# Patient Record
Sex: Male | Born: 1955 | Race: Black or African American | Hispanic: No | Marital: Married | Smoking: Never smoker
Health system: Southern US, Community
[De-identification: ages and names within clinical notes are randomized; demographics above are authoritative.]

## PROBLEM LIST (undated history)

## (undated) DIAGNOSIS — M1990 Unspecified osteoarthritis, unspecified site: Secondary | ICD-10-CM

## (undated) DIAGNOSIS — E119 Type 2 diabetes mellitus without complications: Secondary | ICD-10-CM

## (undated) DIAGNOSIS — I10 Essential (primary) hypertension: Secondary | ICD-10-CM

## (undated) DIAGNOSIS — E7800 Pure hypercholesterolemia, unspecified: Secondary | ICD-10-CM

## (undated) HISTORY — PX: TONSILLECTOMY: SUR1361

## (undated) HISTORY — PX: KNEE SURGERY: SHX244

---

## 2002-06-08 ENCOUNTER — Ambulatory Visit (HOSPITAL_BASED_OUTPATIENT_CLINIC_OR_DEPARTMENT_OTHER): Admission: RE | Admit: 2002-06-08 | Discharge: 2002-06-08 | Payer: Self-pay | Admitting: Orthopedic Surgery

## 2002-11-07 ENCOUNTER — Ambulatory Visit (HOSPITAL_BASED_OUTPATIENT_CLINIC_OR_DEPARTMENT_OTHER): Admission: RE | Admit: 2002-11-07 | Discharge: 2002-11-07 | Payer: Self-pay | Admitting: Orthopedic Surgery

## 2003-01-17 ENCOUNTER — Emergency Department (HOSPITAL_COMMUNITY): Admission: EM | Admit: 2003-01-17 | Discharge: 2003-01-18 | Payer: Self-pay | Admitting: *Deleted

## 2003-04-08 ENCOUNTER — Inpatient Hospital Stay (HOSPITAL_COMMUNITY): Admission: EM | Admit: 2003-04-08 | Discharge: 2003-04-14 | Payer: Self-pay | Admitting: Emergency Medicine

## 2007-09-11 ENCOUNTER — Emergency Department (HOSPITAL_COMMUNITY): Admission: EM | Admit: 2007-09-11 | Discharge: 2007-09-11 | Payer: Self-pay | Admitting: Emergency Medicine

## 2007-11-19 ENCOUNTER — Ambulatory Visit (HOSPITAL_COMMUNITY): Admission: RE | Admit: 2007-11-19 | Discharge: 2007-11-19 | Payer: Self-pay | Admitting: General Surgery

## 2007-11-19 ENCOUNTER — Encounter (INDEPENDENT_AMBULATORY_CARE_PROVIDER_SITE_OTHER): Payer: Self-pay | Admitting: General Surgery

## 2009-12-16 ENCOUNTER — Ambulatory Visit: Payer: Self-pay | Admitting: Cardiology

## 2009-12-16 ENCOUNTER — Observation Stay (HOSPITAL_COMMUNITY): Admission: EM | Admit: 2009-12-16 | Discharge: 2009-12-19 | Payer: Self-pay | Admitting: Emergency Medicine

## 2011-03-02 LAB — GLUCOSE, CAPILLARY
Glucose-Capillary: 138 mg/dL — ABNORMAL HIGH (ref 70–99)
Glucose-Capillary: 156 mg/dL — ABNORMAL HIGH (ref 70–99)
Glucose-Capillary: 157 mg/dL — ABNORMAL HIGH (ref 70–99)
Glucose-Capillary: 169 mg/dL — ABNORMAL HIGH (ref 70–99)
Glucose-Capillary: 176 mg/dL — ABNORMAL HIGH (ref 70–99)
Glucose-Capillary: 176 mg/dL — ABNORMAL HIGH (ref 70–99)
Glucose-Capillary: 178 mg/dL — ABNORMAL HIGH (ref 70–99)
Glucose-Capillary: 186 mg/dL — ABNORMAL HIGH (ref 70–99)
Glucose-Capillary: 195 mg/dL — ABNORMAL HIGH (ref 70–99)
Glucose-Capillary: 202 mg/dL — ABNORMAL HIGH (ref 70–99)

## 2011-03-02 LAB — BASIC METABOLIC PANEL
BUN: 10 mg/dL (ref 6–23)
BUN: 14 mg/dL (ref 6–23)
BUN: 9 mg/dL (ref 6–23)
CO2: 27 mEq/L (ref 19–32)
CO2: 28 mEq/L (ref 19–32)
CO2: 28 mEq/L (ref 19–32)
Calcium: 8.9 mg/dL (ref 8.4–10.5)
Calcium: 8.9 mg/dL (ref 8.4–10.5)
Calcium: 9.3 mg/dL (ref 8.4–10.5)
Chloride: 102 mEq/L (ref 96–112)
Chloride: 103 mEq/L (ref 96–112)
Chloride: 104 mEq/L (ref 96–112)
Creatinine, Ser: 1.08 mg/dL (ref 0.4–1.5)
Creatinine, Ser: 1.2 mg/dL (ref 0.4–1.5)
Creatinine, Ser: 1.34 mg/dL (ref 0.4–1.5)
GFR calc Af Amer: >60 mL/min (ref >60)
GFR calc Af Amer: >60 mL/min (ref >60)
GFR calc Af Amer: >60 mL/min (ref >60)
GFR calc non Af Amer: 56 mL/min — ABNORMAL LOW (ref >60)
GFR calc non Af Amer: >60 mL/min (ref >60)
GFR calc non Af Amer: >60 mL/min (ref >60)
Glucose, Bld: 146 mg/dL — ABNORMAL HIGH (ref 70–99)
Glucose, Bld: 166 mg/dL — ABNORMAL HIGH (ref 70–99)
Glucose, Bld: 186 mg/dL — ABNORMAL HIGH (ref 70–99)
Potassium: 3.6 mEq/L (ref 3.5–5.1)
Potassium: 3.8 mEq/L (ref 3.5–5.1)
Potassium: 4.3 mEq/L (ref 3.5–5.1)
Sodium: 137 mEq/L (ref 135–145)
Sodium: 137 mEq/L (ref 135–145)
Sodium: 138 mEq/L (ref 135–145)

## 2011-03-02 LAB — CBC
HCT: 40.8 % (ref 39.0–52.0)
HCT: 41.9 % (ref 39.0–52.0)
HCT: 45 % (ref 39.0–52.0)
Hemoglobin: 13.7 g/dL (ref 13.0–17.0)
Hemoglobin: 13.9 g/dL (ref 13.0–17.0)
Hemoglobin: 14.8 g/dL (ref 13.0–17.0)
MCHC: 32.8 g/dL (ref 30.0–36.0)
MCHC: 33.3 g/dL (ref 30.0–36.0)
MCHC: 33.7 g/dL (ref 30.0–36.0)
MCV: 91.2 fL (ref 78.0–100.0)
MCV: 92.7 fL (ref 78.0–100.0)
MCV: 93.4 fL (ref 78.0–100.0)
Platelets: 180 10*3/uL (ref 150–400)
Platelets: 185 10*3/uL (ref 150–400)
Platelets: 198 10*3/uL (ref 150–400)
RBC: 4.4 MIL/uL (ref 4.22–5.81)
RBC: 4.49 MIL/uL (ref 4.22–5.81)
RBC: 4.94 MIL/uL (ref 4.22–5.81)
RDW: 14.9 % (ref 11.5–15.5)
RDW: 15.2 % (ref 11.5–15.5)
RDW: 15.4 % (ref 11.5–15.5)
WBC: 10.3 10*3/uL (ref 4.0–10.5)
WBC: 10.3 10*3/uL (ref 4.0–10.5)
WBC: 11 10*3/uL — ABNORMAL HIGH (ref 4.0–10.5)

## 2011-03-02 LAB — HEMOGLOBIN A1C
Hgb A1c MFr Bld: 6.9 % — ABNORMAL HIGH (ref 4.6–6.1)
Mean Plasma Glucose: 151 mg/dL

## 2011-03-02 LAB — POCT CARDIAC MARKERS
CKMB, poc: 1.3 ng/mL (ref 1.0–8.0)
Myoglobin, poc: 85 ng/mL (ref 12–200)
Troponin i, poc: <0.05 ng/mL (ref 0.00–0.09)

## 2011-03-02 LAB — CK TOTAL AND CKMB (NOT AT ARMC)
CK, MB: 1.7 ng/mL (ref 0.3–4.0)
Relative Index: 0.6 (ref 0.0–2.5)
Total CK: 289 U/L — ABNORMAL HIGH (ref 7–232)

## 2011-03-02 LAB — HEPARIN LEVEL (UNFRACTIONATED)
Heparin Unfractionated: 0.12 IU/mL — ABNORMAL LOW (ref 0.30–0.70)
Heparin Unfractionated: 0.14 IU/mL — ABNORMAL LOW (ref 0.30–0.70)
Heparin Unfractionated: 0.21 IU/mL — ABNORMAL LOW (ref 0.30–0.70)
Heparin Unfractionated: 0.39 IU/mL (ref 0.30–0.70)
Heparin Unfractionated: 0.43 IU/mL (ref 0.30–0.70)

## 2011-03-02 LAB — CARDIAC PANEL(CRET KIN+CKTOT+MB+TROPI)
CK, MB: 1.4 ng/mL (ref 0.3–4.0)
CK, MB: 1.7 ng/mL (ref 0.3–4.0)
CK, MB: 1.7 ng/mL (ref 0.3–4.0)
Relative Index: 0.5 (ref 0.0–2.5)
Relative Index: 0.5 (ref 0.0–2.5)
Relative Index: 0.6 (ref 0.0–2.5)
Total CK: 288 U/L — ABNORMAL HIGH (ref 7–232)
Total CK: 291 U/L — ABNORMAL HIGH (ref 7–232)
Total CK: 340 U/L — ABNORMAL HIGH (ref 7–232)
Troponin I: 0.01 ng/mL (ref 0.00–0.06)
Troponin I: 0.01 ng/mL (ref 0.00–0.06)
Troponin I: 0.01 ng/mL (ref 0.00–0.06)

## 2011-03-02 LAB — LIPID PANEL
Cholesterol: 170 mg/dL (ref 0–200)
HDL: 36 mg/dL — ABNORMAL LOW (ref >39)
LDL Cholesterol: 100 mg/dL — ABNORMAL HIGH (ref 0–99)
Total CHOL/HDL Ratio: 4.7 RATIO
Triglycerides: 169 mg/dL — ABNORMAL HIGH (ref <150)
VLDL: 34 mg/dL (ref 0–40)

## 2011-03-02 LAB — MAGNESIUM: Magnesium: 1.8 mg/dL (ref 1.5–2.5)

## 2011-03-02 LAB — TROPONIN I: Troponin I: 0.01 ng/mL (ref 0.00–0.06)

## 2011-03-02 LAB — DIFFERENTIAL
Basophils Absolute: 0.1 10*3/uL (ref 0.0–0.1)
Basophils Relative: 1 % (ref 0–1)
Eosinophils Absolute: 0.4 10*3/uL (ref 0.0–0.7)
Eosinophils Relative: 4 % (ref 0–5)
Lymphocytes Relative: 36 % (ref 12–46)
Lymphs Abs: 3.7 10*3/uL (ref 0.7–4.0)
Monocytes Absolute: 0.9 10*3/uL (ref 0.1–1.0)
Monocytes Relative: 9 % (ref 3–12)
Neutro Abs: 5.3 10*3/uL (ref 1.7–7.7)
Neutrophils Relative %: 51 % (ref 43–77)

## 2011-03-02 LAB — TSH
TSH: 19.644 u[IU]/mL — ABNORMAL HIGH (ref 0.350–4.500)
TSH: 24.806 u[IU]/mL — ABNORMAL HIGH (ref 0.350–4.500)

## 2011-03-02 LAB — T4, FREE: Free T4: 0.61 ng/dL — ABNORMAL LOW (ref 0.80–1.80)

## 2011-03-02 LAB — APTT: aPTT: 90 seconds — ABNORMAL HIGH (ref 24–37)

## 2011-03-02 LAB — PROTIME-INR
INR: 1.07 (ref 0.00–1.49)
Prothrombin Time: 13.8 seconds (ref 11.6–15.2)

## 2011-04-29 NOTE — Op Note (Signed)
NAMEHYKEEM, OJEDA                ACCOUNT NO.:  [id number]   MEDICAL RECORD NO.:  [id number]          PATIENT TYPE:  AMB   LOCATION:  DAY                           FACILITY:  [facility]   PHYSICIAN:  Tilford Pillar, MD      DATE OF BIRTH:  [birth year]   DATE OF PROCEDURE:  11/19/2007  DATE OF DISCHARGE:                               OPERATIVE REPORT   PREOPERATIVE DIAGNOSIS:  Sebaceous cyst on the frontal scalp.   POSTPROCEDURE DIAGNOSIS:  Sebaceous cyst on the frontal scalp.   PROCEDURES:  Excision of a sebaceous cyst in the scalp 1 cm incision.   SURGEON:  Dr. Tilford Pillar   ANESTHESIA:  Sedation with 1% Xylocaine with epinephrine for local  anesthetic.   SPECIMENS:  Cyst.   ESTIMATED BLOOD LOSS:  Minimal.   INDICATIONS:  The patient is a 55 year old African-American male whom I  had seen previously as an outpatient with a prior infected sebaceous  cyst of the frontal scalp.  This was lanced in my office upon resolution  and reduction of the swelling around this area.  This was because the  patient plans for excision.  Risks, benefits, and alternatives were  discussed at length with the patient.  The patient's questions and  concerns were addressed.  The patient was consented for the planned  procedure.   OPERATION:  The patient was taken to the operating room and was placed  in a supine position on the operating table at which time was given the  sedation and O2 by nasal cannula.  At this point, his scalp was prepped  with Betadine solution.  An eye drape was utilized for draping the area.  At this point, local anesthetic was injected.  An elliptical skin  incision was made over the cyst with the scalpel.  All dissection was  conducted sharply with the scalpel up to the removal of the entire cyst  to make sure all was removed and was sent as a primary specimen to path.  At this point, hemostasis was obtained using a needle tip electrocautery  device.  Once hemostasis was  obtained, additional local anesthetic was  injected.  A 3-0 Vicryl suture was utilized to reapproximate deep  subcuticular tissue in a figure-of-eight fashion.  Again, this  approximated the tissue well, and a 4-0 Monocryl was utilized to  reapproximate the skin edges in a subcuticular fashion.  The skin was  washed and dried with a moist and dry towel.  Benzoin was applied around  the incision.  Quarter-inch Steri-Strips were placed, and drapes were  removed.  The patient was allowed to come out of his sedation and was  transferred to the recovery area in stable condition.  At the conclusion  of the procedure, all instrument, sponge, and needle counts were  correct.  The patient tolerated the procedure well.      Tilford Pillar, MD  Electronically Signed     BZ/MEDQ  D:  11/19/2007  T:  11/19/2007  Job:  [id number]   cc:   Ramon Dredge L. Juanetta Gosling, M.D.  Fax: [phone]

## 2011-04-29 NOTE — H&P (Signed)
NAME:  Lance Petty, RASNIC                ACCOUNT NO.:  [id number]   MEDICAL RECORD NO.:  [id number]          PATIENT TYPE:  AMB   LOCATION:  DAY                           FACILITY:  [facility]   PHYSICIAN:  Tilford Pillar, MD      DATE OF BIRTH:  Mar 18, 1956   DATE OF ADMISSION:  DATE OF DISCHARGE:  LH                              HISTORY & PHYSICAL   CHIEF COMPLAINT:  Scalp sebaceous cyst.   HISTORY OF PRESENT ILLNESS:  The patient is a 55 year old male who  presented with a history of an infected cyst on his forehead, along his  brow line.  This had become exquisitely painful and initially started  draining.  He had had a prior episode similarly in the past which  resolved spontaneously.  This was initially incised and drained in my  office by myself, and he was started on a course of antibiotics.  Upon  resolution, he returns to the office at this point with no  symptomatology, no pain, no additional drainage or swelling in the area.  He has had no fever or chills.   PAST MEDICAL HISTORY:  1. Diabetes mellitus type 2.  He is insulin dependent.  2. No cardiac or pulmonary issues or disease that the patient knows      of.   PAST SURGICAL HISTORY:  None.   MEDICATIONS:  Insulin.   ALLERGIES:  No known drug allergies.   SOCIAL HISTORY:  No tobacco use.  No alcohol use.   REVIEW OF SYSTEMS:  CONSTITUTIONAL:  Unremarkable.  EYES:  Unremarkable.  ENT:  Occasional sinus infections, otherwise unremarkable.  CARDIOVASCULAR:  Unremarkable.  PULMONARY:  Unremarkable.  GASTROINTESTINAL:  Unremarkable.  NEUROLOGIC:  Unremarkable.  ENDOCRINE:  Unremarkable.  MUSCULOSKELETAL:  Unremarkable.  SKIN:  As per HPI.  Otherwise unremarkable.   PHYSICAL EXAMINATION:  GENERAL:  On evaluation in my office, he is in no  acute distress.  He is alert and oriented x3.  HEENT:  Pupils are equal, round, and reactive.  Extraocular movements  are intact.  No scleral icterus or conjunctival pallor is noted.  He has  a small cyst on his frontal scalp, less than 0.5 cm in size.  This is  nontender.  This is very mobile.  It does not feel attached to the  underlying tissue or bone.  No decreased hearing is apparent.  Oral  mucosa is pink and moist.  NECK:  No cervical lymphadenopathy is apparent.  PULMONARY:  Unlabored respirations.  Clear to auscultation bilaterally.  CARDIOVASCULAR:  Regular rate and rhythm.  2+ radial pulses bilaterally.  ABDOMEN:  Soft, nontender, nondistended.  EXTREMITIES:  Warm and dry.   ASSESSMENT AND PLAN:  Sebaceous cyst of frontal scalp.  Risks, benefits,  and alternatives of excision were discussed at length with the patient.  His questions and concerns were addressed, and the patient will be  consented for the planned excision as an outpatient procedure.  At this  point, there is no appearance of any infection with the cyst, and again  we will schedule him for excision of the cyst.  Tilford Pillar, MD  Electronically Signed     BZ/MEDQ  D:  11/16/2007  T:  11/17/2007  Job:  [id number]   cc:   [facility]  Fax: [phone]   Oneal Deputy. Juanetta Gosling, M.D.  Fax: [phone]

## 2011-05-02 NOTE — Group Therapy Note (Signed)
   NAMEGRANTLAND, WANT [facility]                        ACCOUNT NO.:  [id number]   MEDICAL RECORD NO.:  [id number]                   PATIENT TYPE:  INP   LOCATION:  A311                                 FACILITY:  [facility]   PHYSICIAN:  Edward L. Juanetta Gosling, M.D.             DATE OF BIRTH:  03-Aug-1956   DATE OF PROCEDURE:  DATE OF DISCHARGE:  04/14/2003                                   PROGRESS NOTE   PROBLEMS:  Diabetes, sinus infection, headaches related to sinus infections.   SUBJECTIVE:  GENERAL APPEARANCE:  The patient says he feels well today and  has no new complaints.  He is not having any headache now and basically  feels very well.  His exam shows that he looks much more comfortable.  VITAL SIGNS:  His temperature is 97.8, pulse 69, respirations 20, blood  sugars run from 169 to 215.  Blood pressure 148/75.  His I&O +1450.  CHEST:  Quite clear.  HEART:  Regular.  ABDOMEN:  Soft.   ASSESSMENT:  He is much improved.   PLAN:  Discharge today, please see discharge summary for details.                                               Edward L. Juanetta Gosling, M.D.    ELH/MEDQ  D:  04/14/2003  T:  04/14/2003  Job:  [id number]

## 2011-05-02 NOTE — Group Therapy Note (Signed)
   Lance Petty, Lance Petty [facility]                        ACCOUNT NO.:  [id number]   MEDICAL RECORD NO.:  [id number]                   PATIENT TYPE:  INP   LOCATION:  A201                                 FACILITY:  [facility]   PHYSICIAN:  Edward L. Juanetta Gosling, M.D.             DATE OF BIRTH:  [birth year]   DATE OF PROCEDURE:  04/13/2003  DATE OF DISCHARGE:                                   PROGRESS NOTE   PROBLEMS:  1. Diabetes.  2. Sinus infection.   SUBJECTIVE:  [name] says that he feels much better today.  He has no new  complaints. His headache is, in essence, resolved.   OBJECTIVE:  His exam shows that his temperature is 98.1; pulse 76;  respirations 20.  Blood sugar was 62 and in the 150s this morning.  Blood  pressure 146/101.  His sinuses are less tender.  His chest is clear.  His  heart is regular.  Overall he looks better.   ASSESSMENT:  He is much improved.   PLAN:  The plan is to continue his antibiotics, etc. today.  I am hoping to  get him discharged in the morning providing he does not have any new  problems.                                               Edward L. Juanetta Gosling, M.D.    ELH/MEDQ  D:  04/13/2003  T:  04/13/2003  Job:  [id number]

## 2011-05-02 NOTE — Op Note (Signed)
[place]  Patient:    Lance Petty, Lance Petty [facility] Visit Number: [id number] MRN: [id number]          Service Type: DSU Location: [facility] Attending Physician:  Milly Jakob Dictated by:   Harvie Junior, M.D. Proc. Date: 06/08/02 Admit Date:  06/08/2002                             Operative Report  PREOPERATIVE DIAGNOSIS:  Degenerative disk disease with significant patellofemoral pain.  POSTOPERATIVE DIAGNOSIS:  Degenerative disk disease with significant patellofemoral pain.  PROCEDURES: 1. Chondroplasty, medial femoral condyle. 2. Chondroplasty, patellofemoral joint. 3. Lateral retinacular release. 4. Removal of multiple osteochondral loose bodies.  SURGEON:  Harvie Junior, M.D.  ASSISTANT:  Currie Paris. Thedore Mins.  ANESTHESIA:  General.  BRIEF HISTORY:  A 55 year old male with a long history of having significant knee pain.  He ultimately spent a long time walking on concrete floors doing heavy work and has been having significant increasing stiffness and pain in the knee joint.  Because of continued complaints of stiffness and knee pain, the patient was ultimately evaluated in the office with an MRI in hand and noted to have significant thinning of the articular cartilage on the medial side as well as on the patellofemoral joint.  Because of this and failure of all conservative care, the patient was taken to the operating room for operative knee arthroscopy.  DESCRIPTION OF PROCEDURE:  The patient was brought to the operating room and after adequate anesthesia was obtained with a general anesthetic, the patient was placed supine upon the operating table.  The right leg was prepped and draped in the usual sterile fashion.  Following this, routine arthroscopic examination of the knee revealed that there was obvious chondromalacia in the patella with significant chondromalacia of the medial patellar facet. Ultimately the patellar tracking  was somewhat lateral.  At this point the patella was debrided back to a smooth and stable rim.  Attention was then turned to the medial joint line, where there were noted to be significant grade 2 and 3 changes in the medial femoral condyle.  This was debrided back to a smooth and table rim.  Attention was turned laterally, where it was noted to be normal.  Multiple cartilaginous loose bodies were identified within the knee and continued to be irrigated out throughout the case.  The anterior cruciate was identified and noted to be normal.  The medial side was smoothed down to a smooth and stable rim.  The medial meniscus was identified.  It had a little bit of fray but ultimately was stable back to the rim.  Attention was turned to the back of the patellofemoral joint, where there was noted to be a tight lateral retinaculum with lateral patellar tracking.  A lateral retinacular release was performed.  At this point the knee was copiously irrigated and suctioned dry.  The arthroscopic portals were closed with a bandage and a sterile compressive dressing was applied and the patient taken to recovery, where he was noted to be in satisfactory condition.  Estimated blood loss for the procedure was none. Dictated by:   Harvie Junior, M.D. Attending Physician:  Milly Jakob DD:  06/08/02 TD:  06/09/02 Job: [id number]

## 2011-05-02 NOTE — H&P (Signed)
   [name], Lance Petty [facility]                        ACCOUNT NO.:  [id number]   MEDICAL RECORD NO.:  [id number]                   PATIENT TYPE:  INP   LOCATION:  A201                                 FACILITY:  [facility]   PHYSICIAN:  Angus G. Renard Matter, M.D.              DATE OF BIRTH:  [birth year]   DATE OF ADMISSION:  04/08/2003  DATE OF DISCHARGE:                                HISTORY & PHYSICAL   No dictation for this job.                                               Angus G. Renard Matter, M.D.    AGM/MEDQ  D:  04/08/2003  T:  04/08/2003  Job:  [id number]

## 2011-05-02 NOTE — Group Therapy Note (Signed)
   Lance Petty, Lance Petty [facility]                        ACCOUNT NO.:  [id number]   MEDICAL RECORD NO.:  [id number]                   PATIENT TYPE:  INP   LOCATION:  A201                                 FACILITY:  [facility]   PHYSICIAN:  Edward L. Juanetta Gosling, M.D.             DATE OF BIRTH:  08-05-1956   DATE OF PROCEDURE:  04/11/2003  DATE OF DISCHARGE:                                   PROGRESS NOTE   PROBLEM:  Diabetes mellitus, uncontrolled.   SUBJECTIVE:  The patient continues to feel fairly well.  He had a headache  last night.  He was started on Humulin N yesterday and will receive another  dose today.  He is still receiving D-5 at 150 so I am going to stop that.  He has no other new complaints.   OBJECTIVE:  His physical exam shows his temperature is 98.7, pulse 75,  respirations 20, blood sugar 271, blood pressure 138/97.  His blood sugar,  however, has been as high as 456.  His I&O is +2251.  His chest is pretty  clear, his heart is regular, his abdomen is soft.   ASSESSMENT:  He seems to be doing at least a little bit better but he still  has uncontrolled blood sugar.  The cause of his sugar being so uncontrolled  is not clear at this point.   PLAN:  Continue with his current long-acting insulin, continue with his  sliding scale, and add Glucophage to the mix and see if it will make any  difference.  I do not plan any other new treatments now.                                               Edward L. Juanetta Gosling, M.D.    ELH/MEDQ  D:  04/11/2003  T:  04/11/2003  Job:  [id number]

## 2011-05-02 NOTE — Discharge Summary (Signed)
   NAMEJERMOND, Lance Petty [facility]                        ACCOUNT NO.:  [id number]   MEDICAL RECORD NO.:  [id number]                   PATIENT TYPE:  INP   LOCATION:  A311                                 FACILITY:  [facility]   PHYSICIAN:  Edward L. Juanetta Gosling, M.D.             DATE OF BIRTH:  09-Oct-1956   DATE OF ADMISSION:  04/08/2003  DATE OF DISCHARGE:                                 DISCHARGE SUMMARY   FINAL DIAGNOSES:  1. Uncontrolled diabetes.  2. Sinusitis.  3. Headaches related to sinusitis.   HISTORY OF PRESENT ILLNESS:  The patient is a 55 year old who has had a long  known history of diabetes and difficulty controlling his blood sugars over  the last several days.  He has had increased thirst, increased urination.  He has been to my office.  He was given a sliding scale regular insulin, but  did not seem to make much difference.  He then came on to the hospital with  blood sugar over 500.  He was given saline and insulin but clearly needed to  be admitted.   PHYSICAL EXAMINATION:  GENERAL APPEARANCE:  He appeared to be dehydrated.  HEENT:  Nose and throat were clear.  NECK:  Supple without masses.  CHEST:  Fairly clear.  HEART:  Regular.  ABDOMEN:  Soft.   LABORATORY DATA:  White count 6300, hemoglobin 16.6, hematocrit 46.3.  Sodium 122, potassium 3.9, chloride 93, CO2 22, glucose 548, BUN 5,  creatinine 1.2, calcium 8.3.   HOSPITAL COURSE:  He was started on intravenous insulin and improved.  He  had difficulty with headache and eventually underwent CT scan of the head  and sinus which showed that he had completely opacified right maxillary  sinus.  This was treated with Rocephin and decongestants and improved.  His  headache resolved, and he was discharged home in much improved condition.   DISCHARGE MEDICATIONS:  1. Glucophage 1 g b.i.d.  2. Humulin N 20 units b.i.d.  3. Cefzil 250 mg b.i.d. for 10 days.  4. Vioxx 25 mg daily if needed for headache.  5. Entex PSE one b.i.d.  p.r.n. congestion.  6. He is to continue sliding scale Humulin that he is already on.    DISCHARGE INSTRUCTIONS:  He should check his blood sugar four times a day.   FOLLOWUP:  He will follow up in my office in about a month.                                               Edward L. Juanetta Gosling, M.D.    ELH/MEDQ  D:  04/14/2003  T:  04/14/2003  Job:  [id number]

## 2011-05-02 NOTE — Group Therapy Note (Signed)
   Lance Petty, Lance Petty                            ACCOUNT NO.:  [id number]   MEDICAL RECORD NO.:  [id number]                  PATIENT TYPE:   LOCATION:                                       FACILITY:   PHYSICIAN:  Angus G. Renard Matter, M.D.              DATE OF BIRTH:   DATE OF PROCEDURE:  04/09/2003  DATE OF DISCHARGE:                                   PROGRESS NOTE   SUBJECTIVE:  This patient was admitted with hyperglycemia.  He received  insulin drip through the night and sugar has dropped into the 200 range.  He  is now on sliding scale insulin.  He is feeling somewhat better.   OBJECTIVE:  VITAL SIGNS: Blood pressure 135/58, respirations 20, pulse 73,  temperature 98.3.  HEART:  Regular rhythm.  LUNGS:  Clear to P&A.  ABDOMEN:  No palpable organs or masses. Somewhat distended abdomen.   ASSESSMENT:  The patient was admitted with hyperglycemia and insulin-  dependent diabetes in poor control.   PLAN:  Plan to change to sliding scale Humalog insulin today.  Continue to  monitor Metabolic-7.                                               Angus G. Renard Matter, M.D.    AGM/MEDQ  D:  04/09/2003  T:  04/09/2003  Job:  [id number]

## 2011-05-02 NOTE — Group Therapy Note (Signed)
   NAMECYNTHIA, STAINBACK [facility]                        ACCOUNT NO.:  [id number]   MEDICAL RECORD NO.:  [id number]                   PATIENT TYPE:  INP   LOCATION:  A201                                 FACILITY:  [facility]   PHYSICIAN:  Edward L. Juanetta Gosling, M.D.             DATE OF BIRTH:  12-06-1956   DATE OF PROCEDURE:  DATE OF DISCHARGE:                                   PROGRESS NOTE   PROBLEMS:  1. Diabetes uncontrolled.  2. Probable prostatitis.   SUBJECTIVE:  The patient said he is feeling better than he was when he was  admitted.  He came in Saturday 04/08/03 with uncontrolled diabetes.  He had  been seen in my office earlier on Friday with the same thing.  He was given  a sliding scale but this obviously did not work.  He still, however, has  blood sugars that are in the 300s.  We discussed progress at length and to  some extent it appears that his diabetes has been uncontrolled because he  has not been eating properly.  He has been traveling with his son.   OBJECTIVE:  VITAL SIGNS:  Blood pressure 128/80, pulse is 80, respirations  18.  HEENT:  Mucus membranes are slightly dry.  Nose and throat are clear.  NECK:  Supple.  CHEST:  Fairly clear without wheezes, rales, or rhonchi.  HEART:  Regular without murmurs, rubs, or gallops.   LABORATORY DATA:  His blood sugar this morning is over 300.   ASSESSMENT:  He is still not controlled.   PLAN:  Continue treatment and medications to follow.  No new changes today.                                               Edward L. Juanetta Gosling, M.D.    ELH/MEDQ  D:  04/10/2003  T:  04/10/2003  Job:  [id number]

## 2011-05-02 NOTE — Group Therapy Note (Signed)
   Lance Petty, GEISSINGER [facility]                        ACCOUNT NO.:  [id number]   MEDICAL RECORD NO.:  [id number]                   PATIENT TYPE:  INP   LOCATION:  A201                                 FACILITY:  [facility]   PHYSICIAN:  Edward L. Juanetta Gosling, M.D.             DATE OF BIRTH:  10/10/1956   DATE OF PROCEDURE:  04/12/2003  DATE OF DISCHARGE:                                   PROGRESS NOTE   PROBLEMS:  1. Diabetes, uncontrolled.  2. Headaches.   SUBJECTIVE:  The patient says he is feeling better this morning.  He has no  new complaints.  He has no nausea or vomiting.  His headache is still  present but better.   OBJECTIVE:  Exam today shows temperature 97.4, pulse is 97, respirations 20.  Blood sugar 209 last night; it has not been done yet this morning.  Blood  pressure 136/77.  His chest is clear, his heart is regular, his headache is  much improved, his sinuses are nontender.  I&O is -2.   ASSESSMENT:  He is much better.   PLAN:  Continue with treatments.  He had CT of the brain and sinuses  yesterday which preliminary report showed opacification of the right  maxillary sinus and a great deal of mucous.  That would probably be the  cause of his headaches; his brain was normal.                                               Edward L. Juanetta Gosling, M.D.    ELH/MEDQ  D:  04/12/2003  T:  04/12/2003  Job:  [id number]

## 2011-05-02 NOTE — Op Note (Signed)
Lance, [name]                        ACCOUNT NO.:  [id number]   MEDICAL RECORD NO.:  [id number]                   PATIENT TYPE:  AMB   LOCATION:  DSC                                  FACILITY:  [facility]   PHYSICIAN:  Harvie Junior, M.D.                DATE OF BIRTH:  1956-02-03   DATE OF PROCEDURE:  DATE OF DISCHARGE:                                 OPERATIVE REPORT   This is a 55 year old male with no previous surgery.   PREOPERATIVE DIAGNOSIS:  Knee pain, status post arthroscopy with lateral  release.   POSTOPERATIVE DIAGNOSES:  1. Chondromalacia of the medial femoral condyle.  2. Chondromalacia of the patella.  3. Synovial proliferation.   PRINCIPAL PROCEDURE:  1. Chondroplasty of the medial femoral condyle.  2. Chondroplasty of the under surface of the patella.  3. Synovectomy, medial and lateral.   SURGEON:  Dr. Luiz Blare.   ANESTHESIA:  General.   BRIEF HISTORY:  [name] is a 55 year old gentleman with a long history of  having significant right knee pain.  He ultimately underwent arthroscopy and  at that time he was noted to have chondromalacia of the medial femoral  condyle as well as chondromalacia on the posterior surface of the patella.  These were debrided and ultimately did a tracking procedure correction with  his initial operation.  He ultimately, postoperatively, never really did  well.  He had continued chronic effusion and because of this continued  chronic effusion, he ultimately was taken to the operating room for a repeat  arthroscopy.  Arthroscopy at that time showed that there was no significant  isolated mechanical abnormality.  He had significant grade 2 and some mild  grade 3 changes on the medial femoral condyle which was debrided with a  suction shaver, fairly significant proliferation of scar tissue medially and  laterally in the gutters, this was debrided.  There was fairly significant  chondromalacia on the posterior surface of the  patella, grade 2 and grade 3  changes, and again, this was debrided and smoothed back to a smooth and  stable rim. Once this was accomplished, the lateral tibial plateau was  identified and noted to have a little bit of grade 2 change, but really  looked quite good.  The lateral femoral condyle was without abnormality.  The lateral meniscus was normal.  Completion of the synovectomy was done  medially and laterally as well as final debridement of the articular  surfaces.  Once this was completed, the knee was copiously irrigated with  six liters of normal saline irrigation and suctioned dry.  Postoperative  portals were closed with bandages and sterile compressive dressing was  applied, the patient was taken to the recovery room and was noted to be in  satisfactory condition.  Estimated blood loss of this procedure was none.  Harvie Junior, M.D.   Ranae Plumber  D:  11/07/2002  T:  11/07/2002  Job:  [id number]

## 2011-05-02 NOTE — H&P (Signed)
   NAMEKEMOND, AMORIN [facility]                        ACCOUNT NO.:  [id number]   MEDICAL RECORD NO.:  [id number]                   PATIENT TYPE:  INP   LOCATION:  A201                                 FACILITY:  [facility]   PHYSICIAN:  Angus G. Renard Matter, M.D.              DATE OF BIRTH:  05-12-1956   DATE OF ADMISSION:  04/08/2003  DATE OF DISCHARGE:                                HISTORY & PHYSICAL   HISTORY OF PRESENT ILLNESS:  A 55 year old African American male who has  been a known diabetic for several years states that he had difficulty  controlling his blood sugars over the past few days.  He has had symptoms of  diabetes excessive thirst, excess urination for several weeks.  His blood  sugars had been difficult to control and he presented himself to the  emergency room where his blood sugars were over 500.  He was started on  intravenous fluids normal saline after a saline bolus and given 10 units of  regular insulin.  Subsequently he was admitted.   ADMISSION LABORATORY DATA:  His laboratory data at the time of admission,  CBC WBC 6,300, hemoglobin 16.6, hematocrit 46.3.  Electrolytes sodium 122,  potassium 3.9, chloride 93, CO2 22, glucose 548, BUN 5, creatinine 1.2,  calcium 8.3.  Patient was admitted for further care.   FAMILY HISTORY:  Positive for heart disease and diabetes.   SOCIAL HISTORY:  Patient does not smoke or drink alcohol.   PAST MEDICAL AND SURGICAL HISTORY:  1. Patient has had several arthroscopic surgeries on his right knee.  2. Tonsillectomy.  3. Apparently had one hospitalization several years ago for treatment of     diabetes.   REVIEW OF SYSTEMS:  HEENT:  Negative.  CARDIOPULMONARY:  No problem with hemoptysis or dyspnea.  GI:  Increased thirst.  Increased appetite.  GU:  Frequency of urination.   ALLERGIES:  NO KNOWN DRUG ALLERGIES.   MEDICATIONS:  1. Humulin N insulin 60 units daily.  2. Humulin R.   PHYSICAL EXAMINATION:  GENERAL:  Alert male with  respirations and temp.  HEENT:  Eyes, PERRLA.  Tympanic membranes are negative.  Oropharynx benign.  NECK:  Supple.  No JVD or thyroid abnormalities.  LUNGS:  Clear to P&A.  HEART:  Regular rhythm.  ABDOMEN:  Without murmurs or masses.  EXTREMITIES:  Clear of edema .   DIAGNOSIS:  Insulin-dependent-diabetes with hyperglycemia.                                                Angus G. Renard Matter, M.D.    AGM/MEDQ  D:  04/08/2003  T:  04/08/2003  Job:  [id number]

## 2011-09-22 LAB — CBC
HCT: 45.5
Hemoglobin: 14.8
MCHC: 32.5
MCV: 91
Platelets: 208
RBC: 5
RDW: 14.3
WBC: 10.8 — ABNORMAL HIGH

## 2011-09-22 LAB — BASIC METABOLIC PANEL
BUN: 12
CO2: 27
Calcium: 9.2
Chloride: 100
Creatinine, Ser: 1.05
GFR calc Af Amer: >60
GFR calc non Af Amer: >60
Glucose, Bld: 198 — ABNORMAL HIGH
Potassium: 4
Sodium: 137

## 2011-09-25 LAB — CULTURE, ROUTINE-ABSCESS

## 2013-10-21 ENCOUNTER — Emergency Department (HOSPITAL_COMMUNITY)
Admission: EM | Admit: 2013-10-21 | Discharge: 2013-10-21 | Disposition: A | Discharge status: Home / Self Care | Payer: BC Managed Care – PPO | Attending: Emergency Medicine | Admitting: Emergency Medicine

## 2013-10-21 ENCOUNTER — Encounter (HOSPITAL_COMMUNITY): Payer: Self-pay | Admitting: Emergency Medicine

## 2013-10-21 DIAGNOSIS — E7800 Pure hypercholesterolemia, unspecified: Secondary | ICD-10-CM | POA: Insufficient documentation

## 2013-10-21 DIAGNOSIS — K047 Periapical abscess without sinus: Secondary | ICD-10-CM | POA: Insufficient documentation

## 2013-10-21 DIAGNOSIS — E119 Type 2 diabetes mellitus without complications: Secondary | ICD-10-CM | POA: Insufficient documentation

## 2013-10-21 DIAGNOSIS — I10 Essential (primary) hypertension: Secondary | ICD-10-CM | POA: Insufficient documentation

## 2013-10-21 DIAGNOSIS — K029 Dental caries, unspecified: Secondary | ICD-10-CM | POA: Insufficient documentation

## 2013-10-21 HISTORY — DX: Pure hypercholesterolemia, unspecified: E78.00

## 2013-10-21 HISTORY — DX: Type 2 diabetes mellitus without complications: E11.9

## 2013-10-21 HISTORY — DX: Essential (primary) hypertension: I10

## 2013-10-21 MED ORDER — AMOXICILLIN 250 MG PO CAPS
500.0000 mg | ORAL_CAPSULE | Freq: Once | ORAL | Status: AC
  Administered 2013-10-21: 500 mg via ORAL
  Filled 2013-10-21: qty 2

## 2013-10-21 MED ORDER — HYDROMORPHONE HCL PF 1 MG/ML IJ SOLN
1.0000 mg | Freq: Once | INTRAMUSCULAR | Status: AC
  Administered 2013-10-21: 1 mg via INTRAMUSCULAR
  Filled 2013-10-21: qty 1

## 2013-10-21 MED ORDER — OXYCODONE-ACETAMINOPHEN 5-325 MG PO TABS: 1.0000 | ORAL_TABLET | ORAL | Status: DC | PRN

## 2013-10-21 MED ORDER — AMOXICILLIN 500 MG PO CAPS: 500.0000 mg | ORAL_CAPSULE | Freq: Three times a day (TID) | ORAL | Status: DC

## 2013-10-21 NOTE — ED Notes (Signed)
Dental pain for 1 week

## 2013-10-21 NOTE — ED Provider Notes (Signed)
Medical screening examination/treatment/procedure(s) were performed by non-physician practitioner and as supervising physician I was immediately available for consultation/collaboration.  EKG Interpretation   None         Benny Lennert, MD 10/21/13 1945

## 2013-10-21 NOTE — ED Provider Notes (Signed)
CSN: [id number]     Arrival date & time 10/21/13  1716 History   First MD Initiated Contact with Patient 10/21/13 1807     Chief Complaint  Patient presents with  . Dental Pain   (Consider location/radiation/quality/duration/timing/severity/associated sxs/prior Treatment) HPI Comments: Lance Petty is a 57 y.o. Male presenting with a 1 week history of dental pain and gingival swelling.   The patient has a history of injury to the tooth involved,  With the filling falling out 2 weeks ago,  And now has developed increased pain and swelling around the tooth.  There has been no fevers,  Chills, nausea or vomiting, also no complaint of difficulty swallowing,  Although chewing makes pain worse.  The patient has tried ibuprofen without relief of symptoms.         The history is provided by the patient.    Past Medical History  Diagnosis Date  . Diabetes mellitus without complication   . Hypertension   . Hypercholesteremia    Past Surgical History  Procedure Laterality Date  . Knee surgery     History reviewed. No pertinent family history. History  Substance Use Topics  . Smoking status: Never Smoker   . Smokeless tobacco: Not on file  . Alcohol Use: No    Review of Systems  Constitutional: Negative for fever.  HENT: Positive for dental problem. Negative for facial swelling and sore throat.   Respiratory: Negative for shortness of breath.   Musculoskeletal: Negative for neck pain and neck stiffness.    Allergies  Review of patient's allergies indicates no known allergies.  Home Medications   Current Outpatient Rx  Name  Route  Sig  Dispense  Refill  . amoxicillin (AMOXIL) 500 MG capsule   Oral   Take 1 capsule (500 mg total) by mouth 3 (three) times daily.   30 capsule   0   . oxyCODONE-acetaminophen (PERCOCET/ROXICET) 5-325 MG per tablet   Oral   Take 1 tablet by mouth every 4 (four) hours as needed for severe pain.   20 tablet   0    BP 157/87  Pulse 73   Temp(Src) 98.2 F (36.8 C) (Oral)  Resp 20  Ht 5\' 10"  (1.778 m)  Wt 230 lb (104.327 kg)  BMI 33.00 kg/m2  SpO2 99% Physical Exam  Constitutional: He is oriented to person, place, and time. He appears well-developed and well-nourished. No distress.  HENT:  Head: Normocephalic and atraumatic.  Right Ear: Tympanic membrane and external ear normal.  Left Ear: Tympanic membrane and external ear normal.  Mouth/Throat: Oropharynx is clear and moist and mucous membranes are normal. No oral lesions. No trismus in the jaw. Dental abscesses and dental caries present.    Eyes: Conjunctivae are normal.  Neck: Normal range of motion. Neck supple.  Cardiovascular: Normal rate and normal heart sounds.   Pulmonary/Chest: Effort normal.  Abdominal: He exhibits no distension.  Musculoskeletal: Normal range of motion.  Lymphadenopathy:    He has no cervical adenopathy.  Neurological: He is alert and oriented to person, place, and time.  Skin: Skin is warm and dry. No erythema.  Psychiatric: He has a normal mood and affect.    ED Course  Procedures (including critical care time) Labs Review Labs Reviewed - No data to display Imaging Review No results found.  EKG Interpretation   None       MDM   1. Dental abscess    Amoxil,  Oxycodone.  Pt is planning to see  Dr. Clare Charon in [place],  Will call on Monday for appt time.  Pt in severe pain,  Given dilaudid IM injection prior to dc,  Wife driving home.   Burgess Amor, PA-C 10/21/13 1829

## 2013-12-06 ENCOUNTER — Emergency Department (HOSPITAL_COMMUNITY)
Admission: EM | Admit: 2013-12-06 | Discharge: 2013-12-06 | Disposition: A | Discharge status: Home / Self Care | Payer: BC Managed Care – PPO | Attending: Emergency Medicine | Admitting: Emergency Medicine

## 2013-12-06 ENCOUNTER — Encounter (HOSPITAL_COMMUNITY): Payer: Self-pay | Admitting: Emergency Medicine

## 2013-12-06 DIAGNOSIS — E7800 Pure hypercholesterolemia, unspecified: Secondary | ICD-10-CM | POA: Insufficient documentation

## 2013-12-06 DIAGNOSIS — Z7982 Long term (current) use of aspirin: Secondary | ICD-10-CM | POA: Insufficient documentation

## 2013-12-06 DIAGNOSIS — R739 Hyperglycemia, unspecified: Secondary | ICD-10-CM

## 2013-12-06 DIAGNOSIS — Z79899 Other long term (current) drug therapy: Secondary | ICD-10-CM | POA: Insufficient documentation

## 2013-12-06 DIAGNOSIS — E119 Type 2 diabetes mellitus without complications: Secondary | ICD-10-CM | POA: Insufficient documentation

## 2013-12-06 DIAGNOSIS — Z794 Long term (current) use of insulin: Secondary | ICD-10-CM | POA: Insufficient documentation

## 2013-12-06 DIAGNOSIS — I10 Essential (primary) hypertension: Secondary | ICD-10-CM | POA: Insufficient documentation

## 2013-12-06 LAB — GLUCOSE, CAPILLARY
Glucose-Capillary: 420 mg/dL — ABNORMAL HIGH (ref 70–99)
Glucose-Capillary: 322 mg/dL — ABNORMAL HIGH (ref 70–99)

## 2013-12-06 LAB — BASIC METABOLIC PANEL
BUN: 20 mg/dL (ref 6–23)
CO2: 22 mEq/L (ref 19–32)
Calcium: 9.9 mg/dL (ref 8.4–10.5)
Chloride: 91 mEq/L — ABNORMAL LOW (ref 96–112)
Creatinine, Ser: 1.36 mg/dL — ABNORMAL HIGH (ref 0.50–1.35)
GFR calc Af Amer: 66 mL/min — ABNORMAL LOW (ref >90)
GFR calc non Af Amer: 57 mL/min — ABNORMAL LOW (ref >90)
Glucose, Bld: 434 mg/dL — ABNORMAL HIGH (ref 70–99)
Potassium: 4.9 mEq/L (ref 3.5–5.1)
Sodium: 131 mEq/L — ABNORMAL LOW (ref 135–145)

## 2013-12-06 MED ORDER — INSULIN ASPART 100 UNIT/ML ~~LOC~~ SOLN
10.0000 [IU] | Freq: Once | SUBCUTANEOUS | Status: AC
  Administered 2013-12-06: 10 [IU] via SUBCUTANEOUS

## 2013-12-06 NOTE — ED Notes (Signed)
Pt has drunk 1 liter of water

## 2013-12-06 NOTE — ED Provider Notes (Signed)
10:40 PM I spoke with the patient and he is taking glipizide 5 mg twice a day.  We will increase this to glipizide 10 mg twice a day.  PCP followup.  Lyanne Co, MD 12/06/13 2240

## 2013-12-06 NOTE — ED Notes (Signed)
Pt states he took his blood sugar around 1900 and meter read high. Pt states he has been taking his meds regularly.

## 2013-12-06 NOTE — ED Provider Notes (Signed)
CSN: [id number]     Arrival date & time 12/06/13  1931 History  This chart was scribed for Lyanne Co, MD by Dorothey Baseman, ED Scribe. This patient was seen in room [facility] and the patient's care was started at 7:45 PM.    Chief Complaint  Patient presents with  . Hyperglycemia   The history is provided by the patient. No language interpreter was used.   HPI Comments: Lance Petty is a 57 y.o. male with a history of DM who presents to the Emergency Department complaining of hyperglycemia onset about an hour ago (420 measured upon arrival to the ED). Patient reports that his blood glucose usually runs about 130-140. He reports some associated increased thirst and urinary frequency. Patient reports that he takes Lantis twice a day for his DM and has not missed any dosing. Patient denies any recent changes in his diet. He denies nausea, emesis, diarrhea, abdominal pain, dysuria, chest pain, shortness of breath. Patient also has a history of HTN and hypercholesterolemia.   PCP- Dr. Juanetta Gosling   Past Medical History  Diagnosis Date  . Diabetes mellitus without complication   . Hypertension   . Hypercholesteremia    Past Surgical History  Procedure Laterality Date  . Knee surgery     No family history on file. History  Substance Use Topics  . Smoking status: Never Smoker   . Smokeless tobacco: Not on file  . Alcohol Use: No    Review of Systems  A complete 10 system review of systems was obtained and all systems are negative except as noted in the HPI and PMH.   Allergies  Review of patient's allergies indicates no known allergies.  Home Medications   Current Outpatient Rx  Name  Route  Sig  Dispense  Refill  . aspirin EC 81 MG tablet   Oral   Take 81 mg by mouth daily.         . insulin glargine (LANTUS) 100 UNIT/ML injection   Subcutaneous   Inject 80 Units into the skin 2 (two) times daily.         Marland Kitchen LORAZEPAM PO   Oral   Take 1 tablet by mouth 2 (two) times  daily as needed (for ANXIETY).         Marland Kitchen UNKNOWN TO PATIENT   Oral   Take 1 tablet by mouth 3 (three) times daily between meals. For DIABETES         . UNKNOWN TO PATIENT   Oral   Take 1 tablet by mouth daily. For THYROID         . UNKNOWN TO PATIENT   Oral   Take 1 tablet by mouth daily. BLOOD PRESSURE (Patient takes 3 different types once daily on each)         . UNKNOWN TO PATIENT   Oral   Take 1 tablet by mouth at bedtime. CHOLESTEROL         . UNKNOWN TO PATIENT   Oral   Take by mouth daily as needed (for AGITATION).          Triage Vitals: BP 163/86  Pulse 104  Temp(Src) 98.4 F (36.9 C) (Oral)  Resp 20  Ht 5\' 10"  (1.778 m)  Wt 230 lb (104.327 kg)  BMI 33.00 kg/m2  SpO2 96%  Physical Exam  Nursing note and vitals reviewed. Constitutional: He is oriented to person, place, and time. He appears well-developed and well-nourished.  HENT:  Head: Normocephalic.  Eyes:  EOM are normal.  Neck: Normal range of motion.  Cardiovascular: Normal rate, regular rhythm and normal heart sounds.   Pulmonary/Chest: Effort normal and breath sounds normal. No respiratory distress.  Abdominal: Soft. Bowel sounds are normal. He exhibits no distension. There is no tenderness.  Musculoskeletal: Normal range of motion.  Neurological: He is alert and oriented to person, place, and time.  Psychiatric: He has a normal mood and affect.    ED Course  Procedures (including critical care time)  DIAGNOSTIC STUDIES: Oxygen Saturation is 96% on room air, normal by my interpretation.    COORDINATION OF CARE: 7:48 PM- Ordered blood labs. Will order novolog to manage symptoms. Discussed treatment plan with patient at bedside and patient verbalized agreement.     Labs Review Labs Reviewed  GLUCOSE, CAPILLARY - Abnormal; Notable for the following:    Glucose-Capillary 420 (*)    All other components within normal limits  BASIC METABOLIC PANEL - Abnormal; Notable for the  following:    Sodium 131 (*)    Chloride 91 (*)    Glucose, Bld 434 (*)    Creatinine, Ser 1.36 (*)    GFR calc non Af Amer 57 (*)    GFR calc Af Amer 66 (*)    All other components within normal limits  GLUCOSE, CAPILLARY - Abnormal; Notable for the following:    Glucose-Capillary 322 (*)    All other components within normal limits   Imaging Review No results found.  EKG Interpretation   None       MDM   1. Hyperglycemia    10:22 PM Patient's blood sugar improving in the emergency department.  Patient be discharged home at this time.  Bicarbonate normal.  No evidence of diabetic ketoacidosis.  Patient will call me when he returns home and give me a list of his diabetic medications because he does not remember them at this time.  We will likely need to make alterations to these as he states his diet has been diabetic compliant  I personally performed the services described in this documentation, which was scribed in my presence. The recorded information has been reviewed and is accurate.       Lyanne Co, MD 12/06/13 2222

## 2017-06-19 ENCOUNTER — Other Ambulatory Visit (HOSPITAL_COMMUNITY): Payer: Self-pay | Admitting: Family Medicine

## 2017-06-19 ENCOUNTER — Ambulatory Visit (HOSPITAL_COMMUNITY)
Admission: RE | Admit: 2017-06-19 | Discharge: 2017-06-19 | Disposition: A | Discharge status: Home / Self Care | Payer: BC Managed Care – PPO | Source: Ambulatory Visit | Attending: Family Medicine | Admitting: Family Medicine

## 2017-06-19 DIAGNOSIS — M170 Bilateral primary osteoarthritis of knee: Secondary | ICD-10-CM | POA: Insufficient documentation

## 2017-06-19 DIAGNOSIS — M25561 Pain in right knee: Secondary | ICD-10-CM

## 2017-06-19 DIAGNOSIS — G8929 Other chronic pain: Secondary | ICD-10-CM

## 2017-06-19 DIAGNOSIS — M25562 Pain in left knee: Secondary | ICD-10-CM

## 2017-09-16 ENCOUNTER — Encounter: Payer: Self-pay | Admitting: Orthopedic Surgery

## 2017-09-16 ENCOUNTER — Ambulatory Visit (INDEPENDENT_AMBULATORY_CARE_PROVIDER_SITE_OTHER): Payer: BC Managed Care – PPO | Admitting: Orthopedic Surgery

## 2017-09-16 VITALS — BP 134/79 | HR 76 | Resp 18 | Ht 70.0 in | Wt 230.0 lb

## 2017-09-16 DIAGNOSIS — M170 Bilateral primary osteoarthritis of knee: Secondary | ICD-10-CM | POA: Diagnosis not present

## 2017-09-16 MED ORDER — MELOXICAM 7.5 MG PO TABS: 7.5000 mg | ORAL_TABLET | Freq: Every day | ORAL | 5 refills | Status: DC

## 2017-09-16 NOTE — Patient Instructions (Addendum)
Knee Osteoarthritis Treatment Decision Aid Introduction If you have osteoarthritis in your knees, you may have several treatment options. This document will review two of these options.  What are my treatment options? ? Surgery ? Non-surgical treatment may include one option or a combination of several of the following options:: Surgery for this condition is total knee replacement. ? Exercise programs and working with a physical therapist.: This is a procedure to replace the knee joint with an artificial (prosthetic) knee joint. ? Devices to help you move around (assistive devices), like a brace, wrap, splint, or wedge insoles.: It replaces parts of the thigh bone (femur), lower leg bone (tibia), and kneecap (patella) that are removed during the procedure. ? Medicines.: ? Shots of medicine to relieve pain and inflammation, such as steroids and hyaluronic acid.: ? Using a small device which delivers mild pulses through to nerve endings to relieve pain (transcutaneous electrical stimulation, TENS).: ? Applying heat and cold to the knee.: ? Massage.: ? Insertion of needles into certain places on the skin to relieve pain (acupuncture).: ? A plan to control your weight or to lose weight, if you are overweight.:  Who is eligible?  Good candidates may include: ? Surgery ? People who have pain that is tolerable.: People who have pain that does not get better with non-surgical treatments and medicines. ? People who are not allergic to medicines that are used.: People who have moderate or severe pain that makes it hard to do physical activities like walking, rising from a chair, or using stairs. ? People who do not have an illness that makes medicines or injections risky.: People who have pain that affects quality of sleep. ? People who are motivated to do exercise programs or lose weight.: People who do not have an illness that makes surgery risky. ? People who do not have an infection, too much  fluid, or skin breakdown in the knee area.: What are the benefits?  Benefits may include: ? Surgery ? Improved mobility and less pain.: Improved mobility and less pain. ? Not needing surgery.: Pain likely being gone within 2 years after surgery. ? Pain relief that lasts for 6-12 months, starting 1-2 days after steroid injections.: ? Pain relief that lasts for several months, starting several weeks after hyaluronic acid injections.: What are the risks?  Risks may include: ? Surgery ? Failure to relieve symptoms.: Failure to relieve symptoms. ? Joint damage that may continue to get worse.: Instability of the knee. ? Medicine or injection side effects.: Damage to blood vessels, nerves, or other structures in the knee. ? Bruising or bleeding with acupuncture therapy.: Bleeding or a blood clot. ? Skin injury due to incorrect application of heat or cold therapy.: Infection. ? Bruising due to massage.: Decreased range of motion of the knee. ? Increased pain due to exercise programs.: Loosening of the prosthetic joint. ? : Needing knee replacement surgery again in 15-20 years to replace the prosthetic joint. What preparation is needed?  Preparation may include: ? Surgery ? Physical evaluation. This may involve lab work and imaging tests, such as X-rays, MRI, CT scan, or bone scans.: Physical evaluation. This may involve lab work and imaging tests, such as X-rays, MRI, CT scan, or bone scans. ? Planning to have someone take you home from the hospital or clinic, if you have knee injections.: Planning to have someone take you home from the hospital. ? : Arranging for someone to help you for a few days after surgery. ? : Working   with specialists, if you have other medical conditions. ? : Preparing your home for your recovery. ? : Losing weight, if recommended by your provider. ? : Not using any products that contain nicotine or tobacco, such as cigarettes and e-cigarettes. If you need help  quitting, ask your health care provider. ? : Doing exercises as directed by your provider. ? : Having dental care and routine cleanings completed before your procedure. (You should not have dental work done for 3 months after your procedure.) The exact tests and frequency of office visits will be decided by your health care provider based on your individual condition. What are the restrictions after?  Restrictions may include: ? Surgery ? Needing to rest after pain-relief therapies.: Not taking baths, swimming, or using a hot tub until your provider approves. ? Not driving, if you are taking medicines that make you sleepy.: Not playing contact sports until your provider approves. ? Avoiding activities that require a lot of energy for a couple of days after knee injections.: Not driving or using heavy machinery until your provider approves. What can I expect from recovery?  You may experience: ? Surgery ? Tiredness, after therapies for pain relief.: Pain. Medicines and pain relief techniques will help make pain tolerable. ? Needing to move your knee through its full range of motion after knee injections, to get all of the medicine into your knee joint.: Fluid draining from your incision through a tube (drain) for a couple of days. ? Monitoring for a few hours after knee injections to make sure you do not have a reaction to the medicine.: Limited range of motion in the knee that gradually improves over time. What is the impact to quality of life?  Impact to quality of life may include: ? Surgery ? Living with certain side effects of medicines.: Needing to stay in the hospital for a few days after your procedure. ? Needing up to five injections over a period of five weeks, for certain kinds of hyaluronic acid injections.: Needing to use a walker for several weeks. ? Limited physical activity, depending on your symptoms.: Limited physical activity for several weeks. ? Impact to your daily  schedule, including work, to make time for therapy and follow-up visits.: Impact to your daily schedule, including work, to make time for therapy and follow-up visits. ? : ? : The frequency of office visits, therapy, and injections will be decided by your health care provider and care team based on your individual condition. What is the cost?  Costs may include: ? Surgery - $$$ ? Ongoing medicines.: Procedures and recovery. ? Office visits.: Office visits. ? Therapy visits.: Therapy visits. ? Knee injection procedures.: ? Assistive devices.: Exact costs will vary based on your location and insurance plan. Contact your insurance company to find out what is covered. Questions to ask your health care provider:  Ask: ? Surgery ? Am I eligible for this option?: Am I eligible for this option? ? What are my personal risks?: What are my personal risks? ? How often will I need office or therapy visits?: How often will I need office or therapy visits? ? What are the chances that I will need future treatment?: What are the chances that I will need another surgery? ? : How effective is the surgery? Follow these instructions at home: Review these options and decide which one may be right for you.  My decision: ? Non-Surgical Treatment ? This is right for me: ? This is not right for   me: ? I am unsure right now: ? Surgery ? This is right for me: ? This is not right for me: ? I am unsure right now:  This information is not intended to replace advice given to you by your health care provider. Make sure you discuss any questions you have with your health care provider. Document Released: 10/23/2016 Document Revised: 10/23/2016 Document Reviewed: 10/23/2016 Elsevier Interactive Patient Education  2018 [employer].  What You Need to Know About Osteoarthritis Osteoarthritis is a type of arthritis that affects tissue that covers the ends of bones in joints (cartilage). Cartilage acts as a  cushion between the bones and helps them move smoothly. Osteoarthritis results when cartilage in the joints gets worn down. Osteoarthritis is sometimes called "wear and tear" arthritis. Osteoarthritis can affect any joint and can make movement painful. Hips, knees, fingers, and toes are some of the joints that are most often affected by osteoarthritis. You may be more likely to develop osteoarthritis if:  You are middle-aged or older.  You are obese.  You have injured a joint or had surgery on a joint.  You have a family history of osteoarthritis.  How can osteoarthritis affect me? Osteoarthritis can cause:  Pain and swelling in your joint.  Difficulty moving your joint.  A grating or scraping feeling inside the joint when you move it.  Popping or creaking sounds when you move.  This condition can make it harder to do things that you need or want to do each day. Osteoarthritis in a major joint, such as your knee or hip, can make it painful to walk or exercise. If you have osteoarthritis in your hands, you might not be able to grip items, twist your hand, or control small movements of your hands and fingers (fine motor skills). Over time, osteoarthritis could cause you to be less physically active. Being less active increases your risk for other long-term (chronic) health problems, such as diabetes and heart disease. What lifestyle changes can be made? You can lessen the impact that osteoarthritis has on your daily life by:  Switching to low-impact activities that do not put repeated pressure on your joints. For example, if you usually run or jog for exercise, try swimming or riding a bike instead.  Staying active. Build up to at least 150 minutes of physical activity each week to keep your joints healthy and keep your body strong.  Losing weight. If you are overweight or obese, losing weight can take pressure off of your joints. If you need help with weight loss, talk with your health  care provider or a diet and nutrition specialist (dietitian).  What other changes can be made? You can also lessen the effect of osteoarthritis by:  Using assistive devices. Sometimes a brace, wrap, splint, specialized glove, or cane can help. Talk with your health care provider or physical therapist about when and how to use these.  Working with a physical therapist who can help you find ways to do your daily activities without harming your joints. A physical therapist can also teach you exercises and stretches to strengthen the muscles that support your joints.  Treating pain and inflammation. Take over-the-counter and prescription medicines for pain and inflammation only as told by your health care provider. If directed, you may put ice on the affected joint: ? If you have a removable assistive device, remove it as told by your health care provider. ? Put ice in a plastic bag. ? Place a towel between your  skin and the bag. ? Leave the ice on for 20 minutes, 2-3 times a day.  If other measures do not work, you may need joint surgery, such as joint replacement. What can happen if changes are not made? Osteoarthritis is a condition that gets worse over time (a progressive condition). If you do not take steps to strengthen your body and to slow down the progress of the disease, your condition may get worse more quickly. Your joints may stiffen and become swollen, which will make them painful and hard to move. Where to find more information: You can learn more about osteoarthritis from:  The Arthritis Foundation: www.http://www.ingram.com/  [employer] of Arthritis and Musculoskeletal and Skin Diseases: www.niams.https://www.ruiz-smith.com/  Contact a health care provider if:  You cannot do your normal activities comfortably.  Your joint does not function at all.  Your pain is interfering with your sleep.  You are gaining  weight.  Your joint appears to be changing in shape, instead of just being swollen and sore. Summary  Osteoarthritis is a painful joint disease that gets worse over time.  This condition can lead to other long-term (chronic) health problems.  There are changes that you can make to slow down the progression of the disease. This information is not intended to replace advice given to you by your health care provider. Make sure you discuss any questions you have with your health care provider. Document Released: 07/22/2016 Document Revised: 07/24/2016 Document Reviewed: 07/22/2016 Elsevier Interactive Patient Education  2018 [employer].

## 2017-09-16 NOTE — Progress Notes (Signed)
Patient ID: Lance Petty, male   DOB: 1956-02-05, 61 y.o.   MRN: [id number]  Chief Complaint  Patient presents with  . Knee Pain    bilateral knees chronic pain, had injury 1970's jumping out of airplane while in [employer]     HPI Lance Petty is a 61 y.o. male.  Presents for evaluation of both knees  Bilateralmedialkneepain.Hejumpedoutofaplanewhenhewasinthe[place]inthe70seventuallyhadtohave2kneearthroscopiesontherightkneenoneontheleft  Bilateralmedialjointpainwhichismild-to-moderatepreviouslytreatedsuccessfullywithinjectionswhichwouldlastapproximate4-5weeks.[place]the[place]butthe[place]isstartingtobetoomuchforhimhewantshiscaretransferredhere  Nocatchinglockinggivingwayormechanicalsymptoms.Againsymptomshavebeenpresentforseveralyears.Hispainisdullachingandnotassociatedwithanyswellingappearstobeactivityrelated.   Review of Systems No chest pain or shortness of breath no fever chills or skin lesions  Past Medical History:  Diagnosis Date  . Diabetes mellitus without complication (HCC)   . Hypercholesteremia   . Hypertension   . Hypertension     Past Surgical History:  Procedure Laterality Date  . KNEE SURGERY      No family history on file.   Social History  Substance Use Topics  . Smoking status: Never Smoker  . Smokeless tobacco: Never Used  . Alcohol use No    No Known Allergies  Current Outpatient Prescriptions  Medication Sig Dispense Refill  . aspirin EC 81 MG tablet Take 81 mg by mouth daily.    Marland Kitchen LORAZEPAM PO Take 1 tablet by mouth 2 (two) times daily as needed (for ANXIETY).    . metFORMIN (GLUCOPHAGE) 500 MG tablet Take 500 mg by mouth 2 (two) times daily with a meal.    . Oxycodone HCl 10 MG TABS Take 10 mg by mouth 4 (four) times daily.  0  . insulin glargine (LANTUS) 100 UNIT/ML injection Inject 80 Units into the skin 2 (two) times daily.     No current facility-administered medications for this visit.        Physical Exam Blood pressure 134/79,  pulse 76, resp. rate 18, height  (1.778 m), weight 230 lb (104.3 kg). Physical Exam  Musculoskeletal:       Right knee: Medial joint line tenderness noted.       Left knee: Medial joint line tenderness noted.   Appearance, there are no abnormalities in terms of appearance the patient was well-developed and well-nourished. The grooming and hygiene were normal.  Mental status orientation, there was normal alertness and orientation Mood pleasant Ambulatory status Normal without support  Left Knee Exam   Tenderness  The patient is experiencing tenderness in the medial joint line.  Range of Motion  Normal left knee ROM  Muscle Strength  Normal left knee strength  Right Knee Exam   Tenderness  The patient is experiencing tenderness in the medial joint line.  Range of Motion  Normal right knee ROM  Muscle Strength  Normal right knee strength  Comments:  Drawer test anterior posterior negative McMurray's negative  Neurovascular exam shows normal sensation and pulses and perfusion.         Data Reviewed 4 images of the right and left knee are noted. There are taken at the hospital. They're believed to be standing films  Diffuse generalized arthritis is seen primarily in the medial compartment of both knees with minimal secondary bone changes.  The left knee still has a tibiofemoral alignment of valgus the right knee looks to be more varus  Assessment  Encounter Diagnosis  Name Primary?  . Primary osteoarthritis of both knees Yes      Plan  Bilateral knee braces  A trial of low back  Come back in one month if no improvement cortisone injection  If no improvement after that hyaluronic acid injection

## 2017-10-19 ENCOUNTER — Ambulatory Visit: Payer: BC Managed Care – PPO | Admitting: Orthopedic Surgery

## 2018-04-21 ENCOUNTER — Ambulatory Visit: Payer: Self-pay | Admitting: Orthopedic Surgery

## 2019-11-01 ENCOUNTER — Ambulatory Visit (HOSPITAL_COMMUNITY)
Admission: RE | Admit: 2019-11-01 | Discharge: 2019-11-01 | Disposition: A | Discharge status: Home / Self Care | Payer: Self-pay | Source: Ambulatory Visit | Attending: Family Medicine | Admitting: Family Medicine

## 2019-11-01 ENCOUNTER — Other Ambulatory Visit (HOSPITAL_COMMUNITY): Payer: Self-pay | Admitting: Family Medicine

## 2019-11-01 ENCOUNTER — Other Ambulatory Visit: Payer: Self-pay

## 2019-11-01 DIAGNOSIS — M1711 Unilateral primary osteoarthritis, right knee: Secondary | ICD-10-CM

## 2019-12-12 ENCOUNTER — Other Ambulatory Visit: Payer: Self-pay

## 2019-12-12 ENCOUNTER — Encounter: Payer: Self-pay | Admitting: Orthopedic Surgery

## 2019-12-12 ENCOUNTER — Ambulatory Visit (INDEPENDENT_AMBULATORY_CARE_PROVIDER_SITE_OTHER): Payer: Self-pay | Admitting: Orthopedic Surgery

## 2019-12-12 VITALS — BP 156/76 | HR 70 | Ht 70.0 in | Wt 239.0 lb

## 2019-12-12 DIAGNOSIS — M1711 Unilateral primary osteoarthritis, right knee: Secondary | ICD-10-CM

## 2019-12-12 DIAGNOSIS — M1710 Unilateral primary osteoarthritis, unspecified knee: Secondary | ICD-10-CM

## 2019-12-12 NOTE — Progress Notes (Signed)
Follow-up visit  Lance Petty is a 63 year old male with chronic right knee pain currently on oxycodone 10 mg 4 times a day Mobic 7.5 mg a day.  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  After cleaning the skin with alcohol a 20-gauge needle was used to inject the right knee joint. There were no complications. A sterile bandage was applied.   Encounter Diagnosis  Name Primary?  . Primary localized osteoarthritis of knee Yes   Recommend follow-up as needed for injection until he is ready for surgery

## 2019-12-12 NOTE — Patient Instructions (Addendum)
You have received an injection of steroids into the joint. 15% of patients will have increased pain within the 24 hours postinjection.   This is transient and will go away.   We recommend that you use ice packs on the injection site for 20 minutes every 2 hours and extra strength Tylenol 2 tablets every 8 as needed until the pain resolves.  If you continue to have pain after taking the Tylenol and using the ice please call the office for further instructions.     Preparing for Knee Replacement Getting prepared before knee replacement surgery can make your recovery easier and more comfortable. This document provides some tips and guidelines that will help you prepare for your surgery. Talk with your health care provider so you can learn what to expect before, during, and after surgery. Ask questions if you do not understand something. To ease concerns about your financial responsibilities, call your insurance company as soon as you decide to have surgery. Ask how much of your surgery and hospital stay will be covered. Also ask about coverage for medical equipment, rehabilitation facilities, and home care. How should I arrange for help? In the first couple weeks after surgery, it will likely be harder for you to do some of your regular activities. You may get tired easily, and you will have limited movement in your leg. Follow these guidelines to make sure you have all the help you need after your surgery:  Plan to have someone take you home from the hospital. Your health care provider will tell you how many days you can expect to be in the hospital.  Cancel all your work, caregiving, and volunteer responsibilities for at least 4-6 weeks after your surgery.  Plan to have someone stay with you day and night for the first week. This person should be someone you are comfortable with. You may need this person to help you with your exercises and personal care, such as bathing and using the  toilet.  If you live alone, arrange for someone to take care of your home and pets for the first 4-6 weeks after surgery.  Arrange for drivers to take you to and from follow-up appointments, the grocery store, and other places you may need to go for at least 4-6 weeks.  Consider applying for a disabled parking permit. To get an application, contact the Department of Motor Vehicles or your health care provider's office. How should I prepare my home?      Pick a recovery spot, but do not plan on recovering in bed. Sitting upright is better for your health. You may want to use a recliner with a small table nearby. Place the items you use most frequently on that table. These may include the TV remote, a cordless phone, a cell phone, a book or laptop computer, and a water glass.  To see if you will be able to move around in your home with a walker, hold your hands out about 6 inches (15 cm) from your sides, and walk from your recovery spot to your kitchen and bathroom. Then walk from your bed to the bathroom. If you do not hit anything with your hands, you will have enough room for a walker.  Minimize the use of stairs after you return home to reduce your risk of falling or tripping.  Remove all clutter from your floors. Also remove any throw rugs. This will help you avoid tripping after your surgery.  Move the items you use most  often in your kitchen, bathroom, and bedroom to shelves and drawers that are at countertop height.  Prepare a few meals to freeze and reheat later.  Consider getting safety equipment that will be helpful during your recovery, such as: ? Grab bars added in the shower and near the toilet. ? A raised toilet seat to help you get on and off the toilet more easily. ? A tub or shower bench. How should I prepare my body?  Have a preoperative exam. ? During the exam, your health care provider will make sure that your body is healthy enough to safely have the  surgery. ? When you go to the exam, bring a complete list of all your medicines and supplements, including herbs and vitamins. ? You may need to have additional tests to ensure your safety.  Have elective dental care and routine cleanings done before your surgery. Germs from anywhere in your body, including your mouth, can travel to your new joint and infect it. It is important that you do not have any dental work done for at least 3 months after your surgery.  Maintain a healthy diet. Do not change your diet before surgery unless your health care provider tells you to do that.  Do not use any products that contain nicotine or tobacco, such as cigarettes and e-cigarettes. These can delay bone healing after surgery. If you need help quitting, ask your health care provider.  Tell your health care provider if: ? You develop any skin infections or skin irritations. You may need to improve the condition of your skin before surgery. ? You have a fever, a cold, or any other illness in the week before your surgery.  Do not drink any alcohol for at least 48 hours before surgery.  The day before your surgery, follow instructions from your health care provider about showering, eating, drinking, and taking medicines. These directions are for your safety.  Talk to your health care provider about doing exercises before your surgery. ? Be sure to follow the exercise program only as directed by your health care provider. ? Doing these exercises in the weeks before your surgery may help reduce pain and improve function after surgery. Summary  Getting prepared before knee replacement surgery can make your recovery easier and more comfortable.  Prepare your home and arrange for help at home.  Keep all of your preoperative appointments to ensure that you are ready for your surgery.  Plan to have someone take you home from the hospital and stay with you day and night for the first week. This information is  not intended to replace advice given to you by your health care provider. Make sure you discuss any questions you have with your health care provider. Document Released: 03/07/2011 Document Revised: 01/18/2018 Document Reviewed: 01/18/2018 Elsevier Patient Education  2020 [employer]  Total Knee Replacement  Total knee replacement is a surgery to replace a bad knee joint with a man-made (prosthetic) joint. The man-made joint is called a prosthesis. This joint may be made of plastic, metal, or ceramic parts. It replaces parts of the thigh bone (femur), lower leg bone (tibia), and kneecap (patella). This is done to reduce pain and help you move better. Tell your doctor about:  Any allergies you have.  All medicines you are taking. This includes vitamins, herbs, eye drops, creams, and over-the-counter medicines.  Any problems you or family members have had with anesthetic medicines.  Any blood disorders you have.  Any surgeries you  have had.  Any medical conditions you have.  Whether you are pregnant or may be pregnant. What are the risks? Generally, this is a safe procedure. However, problems may occur, such as:  Infection.  Bleeding.  Blood clot.  Allergic reaction to medicines.  Damage to nerves and other parts of the knee.  Not being able to move your knee as much.  A feeling that the knee is weak or unstable.  Loosening of the new joint.  Pain that does not go away (chronic pain). What happens before the procedure? Staying hydrated Follow instructions from your doctor about hydration. These may include:  Up to 2 hours before the procedure - you may continue to drink clear liquids. These include water, clear fruit juice, black coffee, and plain tea.  Eating and drinking Follow instructions from your doctor about eating and drinking. These may include:  8 hours before the procedure - stop eating heavy meals or foods. These include meat, fried foods, or fatty  foods.  6 hours before the procedure - stop eating light meals or foods. These include toast or cereal.  6 hours before the procedure - stop drinking milk or drinks that contain milk.  2 hours before the procedure - stop drinking clear liquids. Medicines Ask your doctor about:  Changing or stopping your normal medicines. This is important.  Taking aspirin and ibuprofen. Do not take these medicines unless your doctor tells you to take them.  Taking over-the-counter medicines, vitamins, herbs, and supplements. Tests and exams  You may have a physical exam.  You may have tests, such as: ? X-rays. ? MRI. ? CT scan. ? Bone scans.  You may have a blood or urine sample taken. Lifestyle   If your doctor tells you to do physical therapy, do exercises as told.  Keep your body and teeth clean. Germs from anywhere in your body can infect your new joint. Tell your doctor if: ? You plan to have dental care and routine cleanings. ? You get any skin infections.  If you are overweight, work with your doctor to reach a safe weight. Extra weight can affect your knee.  Do not use any products that contain nicotine or tobacco, such as cigarettes, e-cigarettes, and chewing tobacco. These can delay healing. If you need help quitting, ask your doctor. General instructions  Plan to have someone take you home from the hospital or clinic.  If you will be going home right after the procedure, plan to have someone with you for at least 24 hours. It is best to have someone to help care for you for at least 4-6 weeks after surgery.  Do not shave your legs just before surgery. This will be done in the hospital if needed.  Ask your doctor: ? How your surgery site will be marked. ? What steps will be taken to help prevent the spread of germs. These may include:  Removing hair at the surgery site.  Washing skin with a germ-killing soap. What happens during the procedure?  An IV tube will be put  into one of your veins.  You will be given one or more of the following: ? A medicine to help you relax (sedative). ? A medicine to numb everything below the injection site (peripheral nerve block). ? A medicine that numbs your body below the waist (spinal anesthetic). ? A medicine to make you fall asleep (general anesthetic).  A cut (incision) will be made in your knee.  Damaged parts of your  thigh bone, lower leg bone, and kneecap will be removed.  Parts of the new joint will be placed on your thigh bone, your lower leg bone, and the underside of your kneecap.  One or more small tubes (drains) may be placed near your cut. This will help to drain fluid.  Your cut will be closed. Your doctor will use stitches (sutures), skin glue, or skin tape (adhesive) strips.  A bandage (dressing) will be placed over your cut. The procedure may vary among doctors and hospitals. What happens after the procedure?  You will be monitored until you leave the hospital or clinic. Your blood pressure, heart rate, breathing rate, and blood oxygen level will be checked.  You will be given medicines for pain.  You may: ? Keep getting fluids through an IV tube. ? Have fluid coming from the drain. ? Have to wear special socks (compression stockings). ? Be given a knee joint motion machine (continuous passive motion machine) to use at home.  You will be told to move around as much as you can.  Do not drive until your health care provider tells you it is okay. Summary  Total knee replacement is a surgery to replace a bad knee joint with a man-made joint.  Before the procedure, follow what you are told about eating, drinking, and taking medicines.  Plan to have someone take you home from the hospital or clinic. This information is not intended to replace advice given to you by your health care provider. Make sure you discuss any questions you have with your health care provider. Document Released:  02/23/2012 Document Revised: 07/15/2018 Document Reviewed: 07/15/2018 Elsevier Patient Education  2020 [employer].

## 2020-01-19 ENCOUNTER — Telehealth: Payer: Self-pay | Admitting: Radiology

## 2020-01-19 NOTE — Telephone Encounter (Signed)
I called, spoke with male at residence.  Patient is not there at the moment, she will have him call me back. [facility] auth cannot be retroactive.  He will have a balance from Dec 20 visit.  Current [facility] Berkley Harvey is scanned in chart.  Valid 12-26-19 thru 06-23-20. #[id number]

## 2020-02-29 ENCOUNTER — Ambulatory Visit (INDEPENDENT_AMBULATORY_CARE_PROVIDER_SITE_OTHER): Payer: No Typology Code available for payment source | Admitting: Orthopedic Surgery

## 2020-02-29 ENCOUNTER — Other Ambulatory Visit: Payer: Self-pay

## 2020-02-29 ENCOUNTER — Encounter: Payer: Self-pay | Admitting: Orthopedic Surgery

## 2020-02-29 VITALS — BP 150/80 | HR 88 | Ht 70.0 in | Wt 230.0 lb

## 2020-02-29 DIAGNOSIS — M1711 Unilateral primary osteoarthritis, right knee: Secondary | ICD-10-CM | POA: Diagnosis not present

## 2020-02-29 DIAGNOSIS — M1710 Unilateral primary osteoarthritis, unspecified knee: Secondary | ICD-10-CM

## 2020-02-29 NOTE — Progress Notes (Signed)
Chief Complaint  Patient presents with  . Knee Pain    right     64 year old male previously seen for possible knee replacement.  BMI is 33 not a smoker does have opioid issue with chronic oxycodone 10 mg 4 times a day  He got a shot in December so with 3 months since his last injection he said it helped and he would like to get another one  Knee looks fine for injection  Inject right knee joint  Procedure note right knee injection   verbal consent was obtained to inject right knee joint  Timeout was completed to confirm the site of injection  The medications used were 40 mg of Depo-Medrol and 1% lidocaine 3 cc  Anesthesia was provided by ethyl chloride and the skin was prepped with alcohol.  After cleaning the skin with alcohol a 20-gauge needle was used to inject the right knee joint. There were no complications. A sterile bandage was applied.   Encounter Diagnosis  Name Primary?  . Primary localized osteoarthritis of knee Yes

## 2020-09-05 ENCOUNTER — Ambulatory Visit (INDEPENDENT_AMBULATORY_CARE_PROVIDER_SITE_OTHER): Payer: Self-pay | Admitting: Orthopedic Surgery

## 2020-09-05 ENCOUNTER — Encounter: Payer: Self-pay | Admitting: Orthopedic Surgery

## 2020-09-05 ENCOUNTER — Other Ambulatory Visit: Payer: Self-pay

## 2020-09-05 VITALS — BP 159/75 | HR 79 | Ht 70.0 in | Wt 234.0 lb

## 2020-09-05 DIAGNOSIS — M1711 Unilateral primary osteoarthritis, right knee: Secondary | ICD-10-CM

## 2020-09-05 DIAGNOSIS — M25561 Pain in right knee: Secondary | ICD-10-CM

## 2020-09-05 DIAGNOSIS — M1710 Unilateral primary osteoarthritis, unspecified knee: Secondary | ICD-10-CM

## 2020-09-05 DIAGNOSIS — G8929 Other chronic pain: Secondary | ICD-10-CM

## 2020-09-05 DIAGNOSIS — Z79891 Long term (current) use of opiate analgesic: Secondary | ICD-10-CM

## 2020-09-05 NOTE — Patient Instructions (Signed)

## 2020-09-05 NOTE — Progress Notes (Signed)
Chief Complaint  Patient presents with  . Knee Pain    right / discuss surgery Total knee/ also wants injection     Encounter Diagnoses  Name Primary?  . Primary localized osteoarthritis of knee Yes  . Chronic prescription opiate use   . Chronic pain of right knee     Lance Petty is some considering knee replacement however right now were on restricted admissions  He will need overnight stay based on his medication opioid use  Please complaining of right knee pain his knee looks good for injection we will call him when the hospital opens up for elective surgeries again Procedure note right knee injection   verbal consent was obtained to inject right knee joint  Timeout was completed to confirm the site of injection  The medications used were 40 mg of Depo-Medrol and 1% lidocaine 3 cc  Anesthesia was provided by ethyl chloride and the skin was prepped with alcohol.  After cleaning the skin with alcohol a 20-gauge needle was used to inject the right knee joint. There were no complications. A sterile bandage was applied.

## 2020-09-17 ENCOUNTER — Ambulatory Visit: Payer: Self-pay | Admitting: Orthopedic Surgery

## 2020-11-23 ENCOUNTER — Telehealth: Payer: Self-pay | Admitting: Radiology

## 2020-11-23 ENCOUNTER — Other Ambulatory Visit: Payer: Self-pay | Admitting: Orthopedic Surgery

## 2020-11-23 NOTE — Telephone Encounter (Signed)
Yes 7 days before

## 2020-11-23 NOTE — Telephone Encounter (Signed)
I called him to advise and he voiced understanding Told him he may want to taper to make sure he doesn't feel bad with stopping the meds all at once.

## 2020-11-23 NOTE — Telephone Encounter (Signed)
I called patient about surgery / Total knee replacement right Have scheduled him for Jan 11th  He is on Oxycodone 10 mg qid, do you want him to try a drug holiday?

## 2020-12-04 ENCOUNTER — Telehealth: Payer: Self-pay | Admitting: Radiology

## 2020-12-04 NOTE — Telephone Encounter (Signed)
I need to notify the [place] of patients upcoming surgery on Jan 11th, I can not locate a fax number and the phone number is recording only  I have given paperwork to Okey Regal to try to see if we can locate a fax number or a different phone number to try to contact the [place]

## 2020-12-05 ENCOUNTER — Other Ambulatory Visit: Payer: Self-pay

## 2020-12-05 ENCOUNTER — Ambulatory Visit (INDEPENDENT_AMBULATORY_CARE_PROVIDER_SITE_OTHER): Payer: No Typology Code available for payment source | Admitting: Orthopedic Surgery

## 2020-12-05 ENCOUNTER — Encounter: Payer: Self-pay | Admitting: Orthopedic Surgery

## 2020-12-05 VITALS — BP 170/81 | HR 79 | Ht 70.0 in | Wt 234.0 lb

## 2020-12-05 DIAGNOSIS — M1710 Unilateral primary osteoarthritis, unspecified knee: Secondary | ICD-10-CM

## 2020-12-05 DIAGNOSIS — M25561 Pain in right knee: Secondary | ICD-10-CM

## 2020-12-05 DIAGNOSIS — M1711 Unilateral primary osteoarthritis, right knee: Secondary | ICD-10-CM | POA: Diagnosis not present

## 2020-12-05 DIAGNOSIS — G8929 Other chronic pain: Secondary | ICD-10-CM

## 2020-12-05 DIAGNOSIS — Z79891 Long term (current) use of opiate analgesic: Secondary | ICD-10-CM

## 2020-12-05 NOTE — Progress Notes (Signed)
FOLLOW-UP OFFICE VISIT   Encounter Diagnoses  Name Primary?  . Primary localized osteoarthritis of knee Yes  . Chronic prescription opiate use   . Chronic pain of right knee     64 year old male followed for osteoarthritis of the right knee he is also on chronic opioid therapy which will cause some issues and needs to be closely managed around the time of surgery he status post an injection in his right knee 09/05/20.  (and prior treatment)  Past Medical History:  Diagnosis Date  . Diabetes mellitus without complication (HCC)   . Hypercholesteremia   . Hypertension   . Hypertension    Past Surgical History:  Procedure Laterality Date  . KNEE SURGERY     Family History  Problem Relation Age of Onset  . Diabetes Mother   . Cancer Father        prostate    Social History   Tobacco Use  . Smoking status: Never Smoker  . Smokeless tobacco: Never Used  Vaping Use  . Vaping Use: Never used  Substance Use Topics  . Alcohol use: No  . Drug use: No    Current Outpatient Medications:  .  aspirin EC 81 MG tablet, Take 81 mg by mouth daily., Disp: , Rfl:  .  insulin glargine (LANTUS) 100 UNIT/ML injection, Inject 80 Units into the skin 2 (two) times daily., Disp: , Rfl:  .  LORAZEPAM PO, Take 1 tablet by mouth 2 (two) times daily as needed (for ANXIETY)., Disp: , Rfl:  .  meloxicam (MOBIC) 7.5 MG tablet, Take 1 tablet (7.5 mg total) by mouth daily., Disp: 30 tablet, Rfl: 5 .  metFORMIN (GLUCOPHAGE) 500 MG tablet, Take 500 mg by mouth 2 (two) times daily with a meal., Disp: , Rfl:  .  Oxycodone HCl 10 MG TABS, Take 10 mg by mouth 4 (four) times daily., Disp: , Rfl: 0   + EXAM FINDINGS: his ROM is 0-120 he has tenderness on the medical joint line   ASSESSMENT AND PLAN 64 year old male presents for follow-up prior to right total knee replacement.  Pending [facility] approval  The procedure has been fully reviewed with the patient; The risks and benefits of surgery have been discussed  and explained and understood. Alternative treatment has also been reviewed, questions were encouraged and answered. The postoperative plan is also been reviewed.   Right total knee

## 2020-12-17 NOTE — Patient Instructions (Signed)
Lance Petty  12/17/2020     @PREFPERIOPPHARMACY @   Your procedure is scheduled on  12/25/2020  Report to [facility] at  [email or web address]  Call this number if you have problems the morning of surgery:  [phone]   Remember:  Do not eat or drink after midnight.                        Take these medicines the morning of surgery with A SIP OF WATER  Amlodipine, buspar, prozac, oxycodone(if needed). Take 25 units of your glargine the night before your procedure. DO NOT take any medications for diabetes the morning of your procedure.    Do not wear jewelry, make-up or nail polish.  Do not wear lotions, powders, or perfumes. Please wear deodorant and brush our teeth.  Do not shave 48 hours prior to surgery.  Men may shave face and neck.  Do not bring valuables to the hospital.  [facility] is not responsible for any belongings or valuables.  Contacts, dentures or bridgework may not be worn into surgery.  Leave your suitcase in the car.  After surgery it may be brought to your room.  For patients admitted to the hospital, discharge time will be determined by your treatment team.  Patients discharged the day of surgery will not be allowed to drive home.   Name and phone number of your driver:   family Special instructions:  DO NOT smoke the morning of your procedure.  Please read over the following fact sheets that you were given. Anesthesia Post-op Instructions and Care and Recovery After Surgery       Total Knee Replacement, Care After This sheet gives you information about how to care for yourself after your procedure. Your health care provider may also give you more specific instructions. If you have problems or questions, contact your health care provider. What can I expect after the procedure? After the procedure, it is common to have:  Pain.  Swelling.  A small amount of blood or clear fluid coming from your incision.  Limited range of motion. Follow  these instructions at home: Medicines  Take over-the-counter and prescription medicines only as told by your health care provider.  If you were prescribed a blood thinner (anticoagulant), take it as told by your health care provider.  Ask your health care provider if the medicine prescribed to you: ? Requires you to avoid driving or using heavy machinery. ? Can cause constipation. You may need to take actions to prevent or treat constipation, such as:  Drink enough fluid to keep your urine pale yellow.  Take over-the-counter or prescription medicines.  Eat foods that are high in fiber, such as beans, whole grains, and fresh fruits and vegetables.  Limit foods that are high in fat and processed sugars, such as fried or sweet foods. Bathing  Do not take baths, swim, or use a hot tub until your health care provider approves. Ask your health care provider if you may take showers. You may only be allowed to take sponge baths.  Keep your bandage (dressing) dry until your health care provider says it can be removed. Incision care and drain care   Follow instructions from your health care provider about how to take care of your incision. Make sure you: ? Wash your hands with soap and water before and after you change your dressing. If soap and water  are not available, use hand sanitizer. ? Change your dressing as told by your health care provider. ? Leave stitches (sutures), skin glue, or adhesive strips in place. These skin closures may need to stay in place for 2 weeks or longer. If adhesive strip edges start to loosen and curl up, you may trim the loose edges. Do not remove adhesive strips completely unless your health care provider tells you to do that.  Check your incision area and drain site every day for signs of infection. Check for: ? More redness, swelling, or pain. ? More fluid or blood. ? Warmth. ? Pus or a bad smell.  If you have a drain, follow instructions from your health  care provider about caring for it. Managing pain, stiffness, and swelling      If directed, put ice on your knee. ? Put ice in a plastic bag or use the icing device (cold flow pad or cryocuff) that you were given. Follow instructions from your health care provider about how to use the icing device. ? Place a towel between your skin and the bag or between your skin and the icing device. ? Leave the ice on for 20 minutes, 2-3 times per day.  If directed, apply heat to the affected area before you exercise. Use the heat source that your health care provider recommends, such as a moist heat pack or a heating pad. ? Place a towel between your skin and the heat source. ? Leave the heat on for 20-30 minutes. ? Remove the heat if your skin turns bright red. This is especially important if you are unable to feel pain, heat, or cold. You may have a greater risk of getting burned.  Move your toes often to avoid stiffness and to lessen swelling.  Raise (elevate) your leg above the level of your heart while you are sitting or lying down. ? Use several pillows to keep your leg straight. ? Do not put a pillow just under the knee. If the knee is bent for a long time, this may lead to stiffness.  Wear elastic knee support as told by your health care provider. Activity  Rest as told by your health care provider.  Avoid sitting for a long time without moving. Get up to take short walks every 1-2 hours. This is important to improve blood flow and breathing. Ask for help if you feel weak or unsteady.  Ask your health care provider what activities are safe for you.  Avoid high-impact activities, including running, jumping rope, and jumping jacks.  Do not play contact sports until your health care provider approves.  Do exercises as told by your physical therapist.  If you have been sent home with a continuous passive motion machine, use it as told by your health care provider. Safety   Do not use  your leg to support your body weight until your health care provider approves. Use crutches or a walker as told by your health care provider.  Do not drive until your health care provider approves. Ask your health care provider when it is safe to drive. General instructions  Do not use any products that contain nicotine or tobacco, such as cigarettes, e-cigarettes, and chewing tobacco. These can delay healing after surgery. If you need help quitting, ask your health care provider.  Wear compression stockings as told by your health care provider.  Tell your health care provider if you plan to have dental work. Also: ? Tell your dentist about your  joint replacement. ? Ask your health care provider if there are any special instructions you need to follow before having dental care and routine cleanings.  Keep all follow-up visits as told by your health care provider. This is important. Contact a health care provider if you have:  More redness, swelling, or pain around your incision or drain.  More fluid or blood coming from your incision or drain.  Pus or a bad smell coming from your incision or drain.  Warmth on your incision or drain site.  A fever.  An incision that breaks open.  Knee pain that does not go away.  Range of motion in your knee that is getting worse.  A prosthesis that feels loose. Get help right away if you have:  Pain or swelling in your calf or thigh.  Shortness of breath or difficulty breathing.  Chest pain. Summary  After the procedure, it is common to have pain and swelling, blood or fluid coming from your incision, and limited range of motion.  Follow instructions from your health care provider about how to take care of your incision.  Use crutches or a walker as told by your health care provider.  If you were prescribed a blood thinner (anticoagulant), take it as told by your health care provider.  Keep all follow-up visits as told by your health  care provider. This is important. This information is not intended to replace advice given to you by your health care provider. Make sure you discuss any questions you have with your health care provider. Document Revised: 04/11/2019 Document Reviewed: 07/15/2018 Elsevier Patient Education  2020 [employer] Inc.  General Anesthesia, Adult, Care After This sheet gives you information about how to care for yourself after your procedure. Your health care provider may also give you more specific instructions. If you have problems or questions, contact your health care provider. What can I expect after the procedure? After the procedure, the following side effects are common:  Pain or discomfort at the IV site.  Nausea.  Vomiting.  Sore throat.  Trouble concentrating.  Feeling cold or chills.  Weak or tired.  Sleepiness and fatigue.  Soreness and body aches. These side effects can affect parts of the body that were not involved in surgery. Follow these instructions at home:  For at least 24 hours after the procedure:  Have a responsible adult stay with you. It is important to have someone help care for you until you are awake and alert.  Rest as needed.  Do not: ? Participate in activities in which you could fall or become injured. ? Drive. ? Use heavy machinery. ? Drink alcohol. ? Take sleeping pills or medicines that cause drowsiness. ? Make important decisions or sign legal documents. ? Take care of children on your own. Eating and drinking  Follow any instructions from your health care provider about eating or drinking restrictions.  When you feel hungry, start by eating small amounts of foods that are soft and easy to digest (bland), such as toast. Gradually return to your regular diet.  Drink enough fluid to keep your urine pale yellow.  If you vomit, rehydrate by drinking water, juice, or clear broth. General instructions  If you have sleep apnea, surgery and  certain medicines can increase your risk for breathing problems. Follow instructions from your health care provider about wearing your sleep device: ? Anytime you are sleeping, including during daytime naps. ? While taking prescription pain medicines, sleeping medicines, or medicines that  make you drowsy.  Return to your normal activities as told by your health care provider. Ask your health care provider what activities are safe for you.  Take over-the-counter and prescription medicines only as told by your health care provider.  If you smoke, do not smoke without supervision.  Keep all follow-up visits as told by your health care provider. This is important. Contact a health care provider if:  You have nausea or vomiting that does not get better with medicine.  You cannot eat or drink without vomiting.  You have pain that does not get better with medicine.  You are unable to pass urine.  You develop a skin rash.  You have a fever.  You have redness around your IV site that gets worse. Get help right away if:  You have difficulty breathing.  You have chest pain.  You have blood in your urine or stool, or you vomit blood. Summary  After the procedure, it is common to have a sore throat or nausea. It is also common to feel tired.  Have a responsible adult stay with you for the first 24 hours after general anesthesia. It is important to have someone help care for you until you are awake and alert.  When you feel hungry, start by eating small amounts of foods that are soft and easy to digest (bland), such as toast. Gradually return to your regular diet.  Drink enough fluid to keep your urine pale yellow.  Return to your normal activities as told by your health care provider. Ask your health care provider what activities are safe for you. This information is not intended to replace advice given to you by your health care provider. Make sure you discuss any questions you have  with your health care provider. Document Revised: 12/04/2017 Document Reviewed: 07/17/2017 Elsevier Patient Education  2020 Elsevier Inc.  Spinal Anesthesia and Epidural Anesthesia, Care After This sheet gives you information about how to care for yourself after your procedure. Your doctor may also give you more specific instructions. If you have problems or questions, call your doctor. Follow these instructions at home: For at least 24 hours after the procedure:   Have a responsible adult stay with you. It is important to have someone help care for you until you are awake and alert.  Rest as needed.  Do not do activities where you could fall or get hurt (injured).  Do not drive.  Do not use heavy machinery.  Do not drink alcohol.  Do not take sleeping pills or medicines that make you sleepy (drowsy).  Do not make important decisions.  Do not sign legal documents.  Do not take care of children on your own. Eating and drinking  If you throw up (vomit), drink water, juice, or soup when nausea and vomiting stop.  Drink enough fluid to keep your pee (urine) pale yellow.  Make sure you do not feel like throwing up (nauseous) before you eat solid foods.  Follow the diet that your doctor recommends. General instructions  Return to your normal activities as told by your doctor. Ask your doctor what activities are safe for you.  Take over-the-counter and prescription medicines only as told by your doctor.  If you have sleep apnea, surgery and certain medicines can raise your risk for breathing problems. Follow instructions from your doctor about when to wear your sleep device. Your doctor may tell you to wear your sleep device: ? Anytime you are sleeping, including during daytime  naps. ? While taking prescription pain medicines, sleeping pills, or medicines that make you sleepy.  Do not use any products that contain nicotine or tobacco. This includes cigarettes and  e-cigarettes. ? If you need help quitting, ask your doctor. ? If you smoke, do not smoke by yourself. Make sure someone is nearby in case you need help.  Keep all follow-up visits as told by your doctor. This is important. Contact a doctor if:  It has been more than one day since your procedure and you feel like throwing up.  It has been more than one day since your procedure and you throw up.  You have a rash. Get help right away if:  You have a fever.  You have a headache that lasts a long time.  You have a very bad headache.  Your vision is blurry.  You see two of a single object (double vision).  You are dizzy or light-headed.  You faint.  Your arms or legs tingle, feel weak, or get numb.  You have trouble breathing.  You cannot pee (urinate). Summary  After the procedure, have a responsible adult stay with you at home until you are fully awake and alert.  Do not do activities that might get you injured. Do not drive, use heavy machinery, drink alcohol, or make important decisions for 24 hours after the procedure.  Take medicines as told by your doctor. Do not use products that contain nicotine or tobacco.  Get help right away if you have a fever, blurry vision, difficulty breathing or passing urine, or weakness or numbness in arms or legs. This information is not intended to replace advice given to you by your health care provider. Make sure you discuss any questions you have with your health care provider. Document Revised: 11/13/2017 Document Reviewed: 03/24/2016 Elsevier Patient Education  2020 [employer] How to Use Chlorhexidine for Bathing Chlorhexidine gluconate (CHG) is a germ-killing (antiseptic) solution that is used to clean the skin. It can get rid of the bacteria that normally live on the skin and can keep them away for about 24 hours. To clean your skin with CHG, you may be given:  A CHG solution to use in the shower or as part of a sponge  bath.  A prepackaged cloth that contains CHG. Cleaning your skin with CHG may help lower the risk for infection:  While you are staying in the intensive care unit of the hospital.  If you have a vascular access, such as a central line, to provide short-term or long-term access to your veins.  If you have a catheter to drain urine from your bladder.  If you are on a ventilator. A ventilator is a machine that helps you breathe by moving air in and out of your lungs.  After surgery. What are the risks? Risks of using CHG include:  A skin reaction.  Hearing loss, if CHG gets in your ears.  Eye injury, if CHG gets in your eyes and is not rinsed out.  The CHG product catching fire. Make sure that you avoid smoking and flames after applying CHG to your skin. Do not use CHG:  If you have a chlorhexidine allergy or have previously reacted to chlorhexidine.  On babies younger than 472 months of age. How to use CHG solution  Use CHG only as told by your health care provider, and follow the instructions on the label.  Use the full amount of CHG as directed. Usually, this is one  bottle. During a shower Follow these steps when using CHG solution during a shower (unless your health care provider gives you different instructions): 1. Start the shower. 2. Use your normal soap and shampoo to wash your face and hair. 3. Turn off the shower or move out of the shower stream. 4. Pour the CHG onto a clean washcloth. Do not use any type of brush or rough-edged sponge. 5. Starting at your neck, lather your body down to your toes. Make sure you follow these instructions: ? If you will be having surgery, pay special attention to the part of your body where you will be having surgery. Scrub this area for at least 1 minute. ? Do not use CHG on your head or face. If the solution gets into your ears or eyes, rinse them well with water. ? Avoid your genital area. ? Avoid any areas of skin that have broken  skin, cuts, or scrapes. ? Scrub your back and under your arms. Make sure to wash skin folds. 6. Let the lather sit on your skin for 1-2 minutes or as long as told by your health care provider. 7. Thoroughly rinse your entire body in the shower. Make sure that all body creases and crevices are rinsed well. 8. Dry off with a clean towel. Do not put any substances on your body afterward-such as powder, lotion, or perfume-unless you are told to do so by your health care provider. Only use lotions that are recommended by the manufacturer. 9. Put on clean clothes or pajamas. 10. If it is the night before your surgery, sleep in clean sheets.  During a sponge bath Follow these steps when using CHG solution during a sponge bath (unless your health care provider gives you different instructions): 1. Use your normal soap and shampoo to wash your face and hair. 2. Pour the CHG onto a clean washcloth. 3. Starting at your neck, lather your body down to your toes. Make sure you follow these instructions: ? If you will be having surgery, pay special attention to the part of your body where you will be having surgery. Scrub this area for at least 1 minute. ? Do not use CHG on your head or face. If the solution gets into your ears or eyes, rinse them well with water. ? Avoid your genital area. ? Avoid any areas of skin that have broken skin, cuts, or scrapes. ? Scrub your back and under your arms. Make sure to wash skin folds. 4. Let the lather sit on your skin for 1-2 minutes or as long as told by your health care provider. 5. Using a different clean, wet washcloth, thoroughly rinse your entire body. Make sure that all body creases and crevices are rinsed well. 6. Dry off with a clean towel. Do not put any substances on your body afterward-such as powder, lotion, or perfume-unless you are told to do so by your health care provider. Only use lotions that are recommended by the manufacturer. 7. Put on clean  clothes or pajamas. 8. If it is the night before your surgery, sleep in clean sheets. How to use CHG prepackaged cloths  Only use CHG cloths as told by your health care provider, and follow the instructions on the label.  Use the CHG cloth on clean, dry skin.  Do not use the CHG cloth on your head or face unless your health care provider tells you to.  When washing with the CHG cloth: ? Avoid your genital area. ?  Avoid any areas of skin that have broken skin, cuts, or scrapes. Before surgery Follow these steps when using a CHG cloth to clean before surgery (unless your health care provider gives you different instructions): 1. Using the CHG cloth, vigorously scrub the part of your body where you will be having surgery. Scrub using a back-and-forth motion for 3 minutes. The area on your body should be completely wet with CHG when you are done scrubbing. 2. Do not rinse. Discard the cloth and let the area air-dry. Do not put any substances on the area afterward, such as powder, lotion, or perfume. 3. Put on clean clothes or pajamas. 4. If it is the night before your surgery, sleep in clean sheets.  For general bathing Follow these steps when using CHG cloths for general bathing (unless your health care provider gives you different instructions). 1. Use a separate CHG cloth for each area of your body. Make sure you wash between any folds of skin and between your fingers and toes. Wash your body in the following order, switching to a new cloth after each step: ? The front of your neck, shoulders, and chest. ? Both of your arms, under your arms, and your hands. ? Your stomach and groin area, avoiding the genitals. ? Your right leg and foot. ? Your left leg and foot. ? The back of your neck, your back, and your buttocks. 2. Do not rinse. Discard the cloth and let the area air-dry. Do not put any substances on your body afterward-such as powder, lotion, or perfume-unless you are told to do so  by your health care provider. Only use lotions that are recommended by the manufacturer. 3. Put on clean clothes or pajamas. Contact a health care provider if:  Your skin gets irritated after scrubbing.  You have questions about using your solution or cloth. Get help right away if:  Your eyes become very red or swollen.  Your eyes itch badly.  Your skin itches badly and is red or swollen.  Your hearing changes.  You have trouble seeing.  You have swelling or tingling in your mouth or throat.  You have trouble breathing.  You swallow any chlorhexidine. Summary  Chlorhexidine gluconate (CHG) is a germ-killing (antiseptic) solution that is used to clean the skin. Cleaning your skin with CHG may help to lower your risk for infection.  You may be given CHG to use for bathing. It may be in a bottle or in a prepackaged cloth to use on your skin. Carefully follow your health care provider's instructions and the instructions on the product label.  Do not use CHG if you have a chlorhexidine allergy.  Contact your health care provider if your skin gets irritated after scrubbing. This information is not intended to replace advice given to you by your health care provider. Make sure you discuss any questions you have with your health care provider. Document Revised: 02/17/2019 Document Reviewed: 10/29/2017 Elsevier Patient Education  2020 [employer].

## 2020-12-18 ENCOUNTER — Other Ambulatory Visit: Payer: Self-pay

## 2020-12-18 ENCOUNTER — Encounter (HOSPITAL_COMMUNITY): Payer: Self-pay

## 2020-12-18 ENCOUNTER — Encounter (HOSPITAL_COMMUNITY)
Admission: RE | Admit: 2020-12-18 | Discharge: 2020-12-18 | Disposition: A | Discharge status: Home / Self Care | Payer: No Typology Code available for payment source | Source: Ambulatory Visit | Attending: Orthopedic Surgery | Admitting: Orthopedic Surgery

## 2020-12-18 DIAGNOSIS — Z01818 Encounter for other preprocedural examination: Secondary | ICD-10-CM | POA: Diagnosis not present

## 2020-12-18 HISTORY — DX: Unspecified osteoarthritis, unspecified site: M19.90

## 2020-12-18 LAB — CBC WITH DIFFERENTIAL/PLATELET
Abs Immature Granulocytes: 0.05 10*3/uL (ref 0.00–0.07)
Basophils Absolute: 0 10*3/uL (ref 0.0–0.1)
Basophils Relative: 0 %
Eosinophils Absolute: 0.2 10*3/uL (ref 0.0–0.5)
Eosinophils Relative: 2 %
HCT: 43.3 % (ref 39.0–52.0)
Hemoglobin: 14.4 g/dL (ref 13.0–17.0)
Immature Granulocytes: 1 %
Lymphocytes Relative: 34 %
Lymphs Abs: 3.1 10*3/uL (ref 0.7–4.0)
MCH: 29.3 pg (ref 26.0–34.0)
MCHC: 33.3 g/dL (ref 30.0–36.0)
MCV: 88 fL (ref 80.0–100.0)
Monocytes Absolute: 0.8 10*3/uL (ref 0.1–1.0)
Monocytes Relative: 9 %
Neutro Abs: 5 10*3/uL (ref 1.7–7.7)
Neutrophils Relative %: 54 %
Platelets: 208 10*3/uL (ref 150–400)
RBC: 4.92 MIL/uL (ref 4.22–5.81)
RDW: 13.8 % (ref 11.5–15.5)
WBC: 9.2 10*3/uL (ref 4.0–10.5)
nRBC: 0 % (ref 0.0–0.2)

## 2020-12-18 LAB — BASIC METABOLIC PANEL
Anion gap: 11 (ref 5–15)
BUN: 17 mg/dL (ref 8–23)
CO2: 23 mmol/L (ref 22–32)
Calcium: 9.5 mg/dL (ref 8.9–10.3)
Chloride: 102 mmol/L (ref 98–111)
Creatinine, Ser: 1.01 mg/dL (ref 0.61–1.24)
GFR, Estimated: >60 mL/min (ref >60)
Glucose, Bld: 152 mg/dL — ABNORMAL HIGH (ref 70–99)
Potassium: 3.6 mmol/L (ref 3.5–5.1)
Sodium: 136 mmol/L (ref 135–145)

## 2020-12-18 LAB — HEMOGLOBIN A1C
Hgb A1c MFr Bld: 6.8 % — ABNORMAL HIGH (ref 4.8–5.6)
Mean Plasma Glucose: 148.46 mg/dL

## 2020-12-19 LAB — PREPARE RBC (CROSSMATCH)

## 2020-12-21 ENCOUNTER — Telehealth: Payer: Self-pay | Admitting: Radiology

## 2020-12-21 ENCOUNTER — Other Ambulatory Visit: Payer: Self-pay | Admitting: Orthopedic Surgery

## 2020-12-21 DIAGNOSIS — M1710 Unilateral primary osteoarthritis, unspecified knee: Secondary | ICD-10-CM

## 2020-12-21 NOTE — Telephone Encounter (Signed)
Received Email from [place] regarding patients upcoming surgery [place], [facility] [email or web address]> Wed 12/19/2020 5:40 PM Hi [name],     Just wanted to let you know the referral covers for surgery. Email or call me if you have any questions.    Veteran Lance Petty, Elbert E[address]ings [address]b 01/26/1956  First appt:  12/05/2020 for this referral

## 2020-12-24 ENCOUNTER — Other Ambulatory Visit: Payer: Self-pay

## 2020-12-24 ENCOUNTER — Other Ambulatory Visit (HOSPITAL_COMMUNITY)
Admission: RE | Admit: 2020-12-24 | Discharge: 2020-12-24 | Disposition: A | Discharge status: Home / Self Care | Payer: No Typology Code available for payment source | Source: Ambulatory Visit | Attending: Orthopedic Surgery | Admitting: Orthopedic Surgery

## 2020-12-24 DIAGNOSIS — Z20822 Contact with and (suspected) exposure to covid-19: Secondary | ICD-10-CM | POA: Insufficient documentation

## 2020-12-24 DIAGNOSIS — Z01812 Encounter for preprocedural laboratory examination: Secondary | ICD-10-CM | POA: Insufficient documentation

## 2020-12-24 LAB — SARS CORONAVIRUS 2 (TAT 6-24 HRS): SARS Coronavirus 2: NEGATIVE

## 2020-12-24 NOTE — H&P (Signed)
Encounter Diagnoses  Name Primary?  . Primary localized osteoarthritis of knee Yes  . Chronic prescription opiate use    . Chronic pain of right knee        65 year old male followed for osteoarthritis of the right knee he is also on chronic opioid therapy which will cause some issues and needs to be closely managed around the time of surgery he status post an injection in his right knee 09/05/20.  (and prior treatment)    Review of systems consistent with chronic pain he takes chronic opioids       Past Medical History:  Diagnosis Date  . Diabetes mellitus without complication (HCC)    . Hypercholesteremia    . Hypertension    . Hypertension           Past Surgical History:  Procedure Laterality Date  . KNEE SURGERY             Family History  Problem Relation Age of Onset  . Diabetes Mother    . Cancer Father          prostate     Social History        Tobacco Use  . Smoking status: Never Smoker  . Smokeless tobacco: Never Used  Vaping Use  . Vaping Use: Never used  Substance Use Topics  . Alcohol use: No  . Drug use: No      Current Outpatient Medications:  .  aspirin EC 81 MG tablet, Take 81 mg by mouth daily., Disp: , Rfl:  .  insulin glargine (LANTUS) 100 UNIT/ML injection, Inject 80 Units into the skin 2 (two) times daily., Disp: , Rfl:  .  LORAZEPAM PO, Take 1 tablet by mouth 2 (two) times daily as needed (for ANXIETY)., Disp: , Rfl:  .  meloxicam (MOBIC) 7.5 MG tablet, Take 1 tablet (7.5 mg total) by mouth daily., Disp: 30 tablet, Rfl: 5 .  metFORMIN (GLUCOPHAGE) 500 MG tablet, Take 500 mg by mouth 2 (two) times daily with a meal., Disp: , Rfl:  .  Oxycodone HCl 10 MG TABS, Take 10 mg by mouth 4 (four) times daily., Disp: , Rfl: 0     General appearance normal oriented x3 mood pleasant affect normal gait no significant limp he does have varus alignment  His right knee is tender on the medial joint line he has 110 degrees of knee flexion with 10 degree  flexion contracture knee is otherwise stable muscle tone is normal skin is clear pulse and perfusion are normal there is no edema he has good sensation and normal lymph nodes.  + EXAM FINDINGS: his ROM is 0-120 he has tenderness on the medical joint line    ASSESSMENT AND PLAN 65 year old male presents for follow-up prior to right total knee replacement.  Pending [facility] approval   The procedure has been fully reviewed with the patient; The risks and benefits of surgery have been discussed and explained and understood. Alternative treatment has also been reviewed, questions were encouraged and answered. The postoperative plan is also been reviewed.     Right total knee

## 2020-12-25 ENCOUNTER — Ambulatory Visit (HOSPITAL_COMMUNITY): Payer: No Typology Code available for payment source | Admitting: Anesthesiology

## 2020-12-25 ENCOUNTER — Ambulatory Visit (HOSPITAL_COMMUNITY)
Admission: RE | Admit: 2020-12-25 | Discharge: 2020-12-25 | Disposition: A | Discharge status: Home / Self Care | Payer: No Typology Code available for payment source | Attending: Orthopedic Surgery | Admitting: Orthopedic Surgery

## 2020-12-25 ENCOUNTER — Ambulatory Visit (HOSPITAL_COMMUNITY): Payer: No Typology Code available for payment source

## 2020-12-25 ENCOUNTER — Encounter (HOSPITAL_COMMUNITY)
Admission: RE | Disposition: A | Discharge status: Home / Self Care | Payer: Self-pay | Source: Home / Self Care | Attending: Orthopedic Surgery

## 2020-12-25 DIAGNOSIS — R2681 Unsteadiness on feet: Secondary | ICD-10-CM | POA: Insufficient documentation

## 2020-12-25 DIAGNOSIS — M1711 Unilateral primary osteoarthritis, right knee: Secondary | ICD-10-CM

## 2020-12-25 DIAGNOSIS — Z794 Long term (current) use of insulin: Secondary | ICD-10-CM | POA: Diagnosis not present

## 2020-12-25 DIAGNOSIS — Z791 Long term (current) use of non-steroidal anti-inflammatories (NSAID): Secondary | ICD-10-CM | POA: Diagnosis not present

## 2020-12-25 DIAGNOSIS — Z79891 Long term (current) use of opiate analgesic: Secondary | ICD-10-CM | POA: Insufficient documentation

## 2020-12-25 DIAGNOSIS — Z833 Family history of diabetes mellitus: Secondary | ICD-10-CM | POA: Diagnosis not present

## 2020-12-25 DIAGNOSIS — Z96651 Presence of right artificial knee joint: Secondary | ICD-10-CM

## 2020-12-25 DIAGNOSIS — Z7982 Long term (current) use of aspirin: Secondary | ICD-10-CM | POA: Diagnosis not present

## 2020-12-25 DIAGNOSIS — M6281 Muscle weakness (generalized): Secondary | ICD-10-CM | POA: Diagnosis not present

## 2020-12-25 DIAGNOSIS — Z79899 Other long term (current) drug therapy: Secondary | ICD-10-CM | POA: Diagnosis not present

## 2020-12-25 HISTORY — PX: TOTAL KNEE ARTHROPLASTY: SHX125

## 2020-12-25 LAB — GLUCOSE, CAPILLARY
Glucose-Capillary: 156 mg/dL — ABNORMAL HIGH (ref 70–99)
Glucose-Capillary: 230 mg/dL — ABNORMAL HIGH (ref 70–99)

## 2020-12-25 LAB — ABO/RH: ABO/RH(D): O POS

## 2020-12-25 SURGERY — ARTHROPLASTY, KNEE, TOTAL
Anesthesia: General | Site: Knee | Laterality: Right

## 2020-12-25 MED ORDER — MIDAZOLAM HCL 2 MG/2ML IJ SOLN
INTRAMUSCULAR | Status: AC
  Filled 2020-12-25: qty 2

## 2020-12-25 MED ORDER — ONDANSETRON HCL 4 MG/2ML IJ SOLN
INTRAMUSCULAR | Status: DC | PRN
  Administered 2020-12-25: 4 mg via INTRAVENOUS

## 2020-12-25 MED ORDER — BUPIVACAINE-EPINEPHRINE (PF) 0.25% -1:200000 IJ SOLN
INTRAMUSCULAR | Status: DC | PRN
  Administered 2020-12-25: 28 mL via PERINEURAL

## 2020-12-25 MED ORDER — LACTATED RINGERS IV SOLN: INTRAVENOUS | Status: DC | PRN

## 2020-12-25 MED ORDER — ORAL CARE MOUTH RINSE: 15.0000 mL | Freq: Once | OROMUCOSAL | Status: AC

## 2020-12-25 MED ORDER — BUPIVACAINE-EPINEPHRINE (PF) 0.5% -1:200000 IJ SOLN
INTRAMUSCULAR | Status: AC
  Filled 2020-12-25: qty 30

## 2020-12-25 MED ORDER — OXYCODONE HCL 5 MG PO TABS
10.0000 mg | ORAL_TABLET | ORAL | Status: DC | PRN
  Administered 2020-12-25: 15 mg via ORAL
  Filled 2020-12-25: qty 3

## 2020-12-25 MED ORDER — BUPIVACAINE LIPOSOME 1.3 % IJ SUSP
INTRAMUSCULAR | Status: AC
  Filled 2020-12-25: qty 20

## 2020-12-25 MED ORDER — METHOCARBAMOL 1000 MG/10ML IJ SOLN: 500.0000 mg | Freq: Four times a day (QID) | INTRAVENOUS | Status: DC | PRN

## 2020-12-25 MED ORDER — LIDOCAINE HCL (PF) 1 % IJ SOLN
INTRAMUSCULAR | Status: AC
  Filled 2020-12-25: qty 30

## 2020-12-25 MED ORDER — LIDOCAINE HCL (CARDIAC) PF 100 MG/5ML IV SOSY
PREFILLED_SYRINGE | INTRAVENOUS | Status: DC | PRN
  Administered 2020-12-25: 50 mg via INTRAVENOUS

## 2020-12-25 MED ORDER — OXYCODONE HCL 5 MG PO TABS: 5.0000 mg | ORAL_TABLET | ORAL | Status: DC | PRN

## 2020-12-25 MED ORDER — LACTATED RINGERS IV BOLUS
250.0000 mL | Freq: Once | INTRAVENOUS | Status: AC
  Administered 2020-12-25: 250 mL via INTRAVENOUS

## 2020-12-25 MED ORDER — LIDOCAINE HCL (PF) 1 % IJ SOLN
INTRAMUSCULAR | Status: DC | PRN
  Administered 2020-12-25: 3 mL

## 2020-12-25 MED ORDER — DEXAMETHASONE SODIUM PHOSPHATE 4 MG/ML IJ SOLN
INTRAMUSCULAR | Status: DC | PRN
  Administered 2020-12-25: 8 mg via PERINEURAL

## 2020-12-25 MED ORDER — ACETAMINOPHEN 500 MG PO TABS
1000.0000 mg | ORAL_TABLET | Freq: Four times a day (QID) | ORAL | Status: DC
  Administered 2020-12-25: 1000 mg via ORAL
  Filled 2020-12-25: qty 2

## 2020-12-25 MED ORDER — HYDROMORPHONE HCL 1 MG/ML IJ SOLN
0.2500 mg | INTRAMUSCULAR | Status: DC | PRN
  Administered 2020-12-25 (×3): 0.5 mg via INTRAVENOUS
  Filled 2020-12-25 (×3): qty 0.5

## 2020-12-25 MED ORDER — SENNOSIDES-DOCUSATE SODIUM 8.6-50 MG PO TABS
1.0000 | ORAL_TABLET | Freq: Every evening | ORAL | Status: DC | PRN
Start: 2020-12-25 — End: 2020-12-25
  Filled 2020-12-25: qty 1

## 2020-12-25 MED ORDER — LIDOCAINE HCL (PF) 2 % IJ SOLN
INTRAMUSCULAR | Status: AC
  Filled 2020-12-25: qty 5

## 2020-12-25 MED ORDER — BUPIVACAINE-EPINEPHRINE (PF) 0.25% -1:200000 IJ SOLN
INTRAMUSCULAR | Status: AC
  Filled 2020-12-25: qty 30

## 2020-12-25 MED ORDER — DEXAMETHASONE SODIUM PHOSPHATE 10 MG/ML IJ SOLN
INTRAMUSCULAR | Status: AC
  Filled 2020-12-25: qty 1

## 2020-12-25 MED ORDER — DEXAMETHASONE SODIUM PHOSPHATE 10 MG/ML IJ SOLN
INTRAMUSCULAR | Status: DC | PRN
  Administered 2020-12-25: 4 mg via INTRAVENOUS

## 2020-12-25 MED ORDER — MIDAZOLAM HCL 5 MG/5ML IJ SOLN
INTRAMUSCULAR | Status: DC | PRN
  Administered 2020-12-25 (×2): 1 mg via INTRAVENOUS

## 2020-12-25 MED ORDER — LACTATED RINGERS IV SOLN: Freq: Once | INTRAVENOUS | Status: AC

## 2020-12-25 MED ORDER — PHENYLEPHRINE 40 MCG/ML (10ML) SYRINGE FOR IV PUSH (FOR BLOOD PRESSURE SUPPORT)
PREFILLED_SYRINGE | INTRAVENOUS | Status: AC
  Filled 2020-12-25: qty 10

## 2020-12-25 MED ORDER — TRANEXAMIC ACID-NACL 1000-0.7 MG/100ML-% IV SOLN
1000.0000 mg | INTRAVENOUS | Status: AC
  Administered 2020-12-25: 1000 mg via INTRAVENOUS
  Filled 2020-12-25: qty 100

## 2020-12-25 MED ORDER — GABAPENTIN 300 MG PO CAPS
300.0000 mg | ORAL_CAPSULE | Freq: Three times a day (TID) | ORAL | Status: DC
  Administered 2020-12-25 (×2): 300 mg via ORAL
  Filled 2020-12-25 (×2): qty 1

## 2020-12-25 MED ORDER — SODIUM CHLORIDE 0.9 % IV SOLN: INTRAVENOUS | Status: DC

## 2020-12-25 MED ORDER — CELECOXIB 100 MG PO CAPS
200.0000 mg | ORAL_CAPSULE | Freq: Two times a day (BID) | ORAL | Status: DC
  Administered 2020-12-25: 200 mg via ORAL
  Filled 2020-12-25: qty 2

## 2020-12-25 MED ORDER — HYDROMORPHONE HCL 1 MG/ML IJ SOLN: 0.5000 mg | INTRAMUSCULAR | Status: DC | PRN

## 2020-12-25 MED ORDER — DEXAMETHASONE SODIUM PHOSPHATE 4 MG/ML IJ SOLN
INTRAMUSCULAR | Status: AC
  Filled 2020-12-25: qty 2

## 2020-12-25 MED ORDER — PROMETHAZINE HCL 25 MG/ML IJ SOLN: 6.2500 mg | INTRAMUSCULAR | Status: DC | PRN

## 2020-12-25 MED ORDER — PHENYLEPHRINE HCL (PRESSORS) 10 MG/ML IV SOLN
INTRAVENOUS | Status: DC | PRN
  Administered 2020-12-25: 120 ug via INTRAVENOUS

## 2020-12-25 MED ORDER — CEFAZOLIN SODIUM-DEXTROSE 2-4 GM/100ML-% IV SOLN
2.0000 g | INTRAVENOUS | Status: AC
  Administered 2020-12-25: 2 g via INTRAVENOUS
  Filled 2020-12-25: qty 100

## 2020-12-25 MED ORDER — LACTATED RINGERS IV BOLUS: 500.0000 mL | Freq: Once | INTRAVENOUS | Status: DC

## 2020-12-25 MED ORDER — METHOCARBAMOL 500 MG PO TABS: 500.0000 mg | ORAL_TABLET | Freq: Four times a day (QID) | ORAL | 0 refills | Status: AC

## 2020-12-25 MED ORDER — MIDAZOLAM HCL 2 MG/2ML IJ SOLN
2.0000 mg | Freq: Once | INTRAMUSCULAR | Status: AC
  Administered 2020-12-25: 2 mg via INTRAVENOUS

## 2020-12-25 MED ORDER — GABAPENTIN 100 MG PO CAPS: 100.0000 mg | ORAL_CAPSULE | Freq: Three times a day (TID) | ORAL | 2 refills | Status: AC

## 2020-12-25 MED ORDER — METHOCARBAMOL 500 MG PO TABS
500.0000 mg | ORAL_TABLET | Freq: Four times a day (QID) | ORAL | Status: DC | PRN
  Administered 2020-12-25: 500 mg via ORAL
  Filled 2020-12-25 (×2): qty 1

## 2020-12-25 MED ORDER — PHENOL 1.4 % MT LIQD: 1.0000 | OROMUCOSAL | Status: DC | PRN

## 2020-12-25 MED ORDER — MENTHOL 3 MG MT LOZG
1.0000 | LOZENGE | OROMUCOSAL | Status: DC | PRN
  Filled 2020-12-25: qty 9

## 2020-12-25 MED ORDER — TRANEXAMIC ACID-NACL 1000-0.7 MG/100ML-% IV SOLN
1000.0000 mg | Freq: Once | INTRAVENOUS | Status: AC
  Administered 2020-12-25: 1000 mg via INTRAVENOUS
  Filled 2020-12-25: qty 100

## 2020-12-25 MED ORDER — CELECOXIB 200 MG PO CAPS: 200.0000 mg | ORAL_CAPSULE | Freq: Every day | ORAL | 0 refills | Status: AC

## 2020-12-25 MED ORDER — DIPHENHYDRAMINE HCL 12.5 MG/5ML PO ELIX
12.5000 mg | ORAL_SOLUTION | ORAL | Status: DC | PRN
  Filled 2020-12-25: qty 10

## 2020-12-25 MED ORDER — DOCUSATE SODIUM 100 MG PO CAPS
100.0000 mg | ORAL_CAPSULE | Freq: Two times a day (BID) | ORAL | Status: DC
  Administered 2020-12-25: 100 mg via ORAL
  Filled 2020-12-25: qty 1

## 2020-12-25 MED ORDER — EPHEDRINE 5 MG/ML INJ
INTRAVENOUS | Status: AC
  Filled 2020-12-25: qty 10

## 2020-12-25 MED ORDER — 0.9 % SODIUM CHLORIDE (POUR BTL) OPTIME
TOPICAL | Status: DC | PRN
  Administered 2020-12-25: 1000 mL

## 2020-12-25 MED ORDER — BUPIVACAINE IN DEXTROSE 0.75-8.25 % IT SOLN
INTRATHECAL | Status: DC | PRN
  Administered 2020-12-25: 2 mL via INTRATHECAL

## 2020-12-25 MED ORDER — PROPOFOL 10 MG/ML IV BOLUS
INTRAVENOUS | Status: AC
  Filled 2020-12-25: qty 20

## 2020-12-25 MED ORDER — MEPERIDINE HCL 50 MG/ML IJ SOLN: 6.2500 mg | INTRAMUSCULAR | Status: DC | PRN

## 2020-12-25 MED ORDER — SODIUM CHLORIDE 0.9 % IR SOLN
Status: DC | PRN
  Administered 2020-12-25: 3000 mL

## 2020-12-25 MED ORDER — ONDANSETRON HCL 4 MG/2ML IJ SOLN
INTRAMUSCULAR | Status: AC
  Filled 2020-12-25: qty 2

## 2020-12-25 MED ORDER — CHLORHEXIDINE GLUCONATE 0.12 % MT SOLN
15.0000 mL | Freq: Once | OROMUCOSAL | Status: AC
  Administered 2020-12-25: 15 mL via OROMUCOSAL
  Filled 2020-12-25: qty 15

## 2020-12-25 MED ORDER — BUPIVACAINE LIPOSOME 1.3 % IJ SUSP
INTRAMUSCULAR | Status: DC | PRN
  Administered 2020-12-25: 20 mL

## 2020-12-25 MED ORDER — DEXAMETHASONE SODIUM PHOSPHATE 10 MG/ML IJ SOLN: 10.0000 mg | Freq: Once | INTRAMUSCULAR | Status: DC

## 2020-12-25 MED ORDER — EPHEDRINE SULFATE 50 MG/ML IJ SOLN
INTRAMUSCULAR | Status: DC | PRN
  Administered 2020-12-25 (×4): 10 mg via INTRAVENOUS

## 2020-12-25 MED ORDER — ASPIRIN 325 MG PO TBEC: 325.0000 mg | DELAYED_RELEASE_TABLET | Freq: Every day | ORAL | 0 refills | Status: AC

## 2020-12-25 MED ORDER — CEFAZOLIN SODIUM-DEXTROSE 2-4 GM/100ML-% IV SOLN
2.0000 g | Freq: Four times a day (QID) | INTRAVENOUS | Status: DC
  Administered 2020-12-25: 2 g via INTRAVENOUS
  Filled 2020-12-25: qty 100

## 2020-12-25 MED ORDER — PROPOFOL 500 MG/50ML IV EMUL
INTRAVENOUS | Status: DC | PRN
  Administered 2020-12-25: 50 ug/kg/min via INTRAVENOUS

## 2020-12-25 MED ORDER — ACETAMINOPHEN 325 MG PO TABS: 325.0000 mg | ORAL_TABLET | Freq: Four times a day (QID) | ORAL | Status: DC | PRN

## 2020-12-25 MED ORDER — OXYCODONE HCL 10 MG PO TABS: 10.0000 mg | ORAL_TABLET | ORAL | 0 refills | Status: AC | PRN

## 2020-12-25 MED ORDER — METOCLOPRAMIDE HCL 5 MG PO TABS
5.0000 mg | ORAL_TABLET | Freq: Three times a day (TID) | ORAL | Status: DC | PRN
Start: 2020-12-25 — End: 2020-12-25

## 2020-12-25 MED ORDER — METOCLOPRAMIDE HCL 5 MG/ML IJ SOLN
5.0000 mg | Freq: Three times a day (TID) | INTRAMUSCULAR | Status: DC | PRN
Start: 2020-12-25 — End: 2020-12-25

## 2020-12-25 MED ORDER — ONDANSETRON HCL 4 MG/2ML IJ SOLN: 4.0000 mg | Freq: Four times a day (QID) | INTRAMUSCULAR | Status: DC | PRN

## 2020-12-25 MED ORDER — ONDANSETRON HCL 4 MG PO TABS
4.0000 mg | ORAL_TABLET | Freq: Four times a day (QID) | ORAL | Status: DC | PRN
  Filled 2020-12-25: qty 1

## 2020-12-25 MED ORDER — PROPOFOL 10 MG/ML IV BOLUS
INTRAVENOUS | Status: AC
  Filled 2020-12-25: qty 40

## 2020-12-25 MED ORDER — POVIDONE-IODINE 10 % EX SWAB: 2.0000 "application " | Freq: Once | CUTANEOUS | Status: DC

## 2020-12-25 SURGICAL SUPPLY — 75 items
ATTUNE MED DOME PAT 38 KNEE (Knees) ×1 IMPLANT
ATTUNE PS FEM RT SZ 8 CEM KNEE (Femur) ×1 IMPLANT
BANDAGE ELASTIC 4 VELCRO NS (GAUZE/BANDAGES/DRESSINGS) ×1 IMPLANT
BANDAGE ELASTIC 6 VELCRO NS (GAUZE/BANDAGES/DRESSINGS) ×2 IMPLANT
BANDAGE ESMARK 6X9 LF (GAUZE/BANDAGES/DRESSINGS) ×1 IMPLANT
BASE TIBIAL CEM ATTUNE SZ 7 (Knees) ×2 IMPLANT
BASEPLATE TIB CEM ATTUNE SZ7 (Knees) IMPLANT
BLADE HEX COATED 2.75 (ELECTRODE) ×2 IMPLANT
BLADE SAGITTAL 25.0X1.27X90 (BLADE) ×3 IMPLANT
BLADE SAW SGTL 11.0X1.19X90.0M (BLADE) ×2 IMPLANT
BNDG CMPR 9X6 STRL LF SNTH (GAUZE/BANDAGES/DRESSINGS) ×1
BNDG CMPR STD VLCR NS LF 5.8X4 (GAUZE/BANDAGES/DRESSINGS) ×2
BNDG CMPR STD VLCR NS LF 5.8X6 (GAUZE/BANDAGES/DRESSINGS) ×1
BNDG ELASTIC 4X5.8 VLCR NS LF (GAUZE/BANDAGES/DRESSINGS) ×4 IMPLANT
BNDG ELASTIC 6X5.8 VLCR NS LF (GAUZE/BANDAGES/DRESSINGS) ×2 IMPLANT
BNDG ESMARK 6X9 LF (GAUZE/BANDAGES/DRESSINGS) ×2
BSPLAT TIB 7 CMNT FX BRNG STRL (Knees) ×1 IMPLANT
CEMENT HV SMART SET (Cement) ×4 IMPLANT
CLOTH BEACON ORANGE TIMEOUT ST (SAFETY) ×2 IMPLANT
COOLER ICEMAN CLASSIC (MISCELLANEOUS) ×2 IMPLANT
COVER LIGHT HANDLE STERIS (MISCELLANEOUS) ×4 IMPLANT
COVER WAND RF STERILE (DRAPES) ×2 IMPLANT
CUFF TOURN SGL QUICK 34 (TOURNIQUET CUFF) ×2
CUFF TRNQT CYL 34X4.125X (TOURNIQUET CUFF) ×1 IMPLANT
DRAPE BACK TABLE (DRAPES) ×2 IMPLANT
DRAPE EXTREMITY T 121X128X90 (DISPOSABLE) ×2 IMPLANT
DRSG MEPILEX BORDER 4X12 (GAUZE/BANDAGES/DRESSINGS) ×2 IMPLANT
DURAPREP 26ML APPLICATOR (WOUND CARE) ×4 IMPLANT
ELECT REM PT RETURN 9FT ADLT (ELECTROSURGICAL) ×2
ELECTRODE REM PT RTRN 9FT ADLT (ELECTROSURGICAL) ×1 IMPLANT
GLOVE BIO SURGEON STRL SZ7 (GLOVE) ×2 IMPLANT
GLOVE BIOGEL PI IND STRL 7.0 (GLOVE) ×2 IMPLANT
GLOVE BIOGEL PI INDICATOR 7.0 (GLOVE) ×6
GLOVE ECLIPSE 6.5 STRL STRAW (GLOVE) ×2 IMPLANT
GLOVE SKINSENSE NS SZ8.0 LF (GLOVE) ×2
GLOVE SKINSENSE STRL SZ8.0 LF (GLOVE) ×2 IMPLANT
GLOVE SS N UNI LF 8.5 STRL (GLOVE) ×2 IMPLANT
GOWN STRL REUS W/TWL LRG LVL3 (GOWN DISPOSABLE) ×7 IMPLANT
GOWN STRL REUS W/TWL XL LVL3 (GOWN DISPOSABLE) ×2 IMPLANT
HANDPIECE INTERPULSE COAX TIP (DISPOSABLE) ×2
HOOD W/PEELAWAY (MISCELLANEOUS) ×9 IMPLANT
INSERT TIBIA FIXED BEARING SZ8 (Insert) ×1 IMPLANT
INST SET MAJOR BONE (KITS) ×2 IMPLANT
IV NS IRRIG 3000ML ARTHROMATIC (IV SOLUTION) ×2 IMPLANT
KIT BLADEGUARD II DBL (SET/KITS/TRAYS/PACK) ×2 IMPLANT
KIT TURNOVER KIT A (KITS) ×2 IMPLANT
MANIFOLD NEPTUNE II (INSTRUMENTS) ×2 IMPLANT
MARKER SKIN DUAL TIP RULER LAB (MISCELLANEOUS) ×2 IMPLANT
NDL HYPO 18GX1.5 BLUNT FILL (NEEDLE) IMPLANT
NDL HYPO 21X1.5 SAFETY (NEEDLE) IMPLANT
NEEDLE HYPO 18GX1.5 BLUNT FILL (NEEDLE) IMPLANT
NEEDLE HYPO 21X1.5 SAFETY (NEEDLE) ×2 IMPLANT
NS IRRIG 1000ML POUR BTL (IV SOLUTION) ×2 IMPLANT
PACK TOTAL JOINT (CUSTOM PROCEDURE TRAY) ×2 IMPLANT
PAD ARMBOARD 7.5X6 YLW CONV (MISCELLANEOUS) ×2 IMPLANT
PAD COLD SHLDR WRAP-ON (PAD) ×2 IMPLANT
PENCIL SMOKE EVACUATOR (MISCELLANEOUS) ×2 IMPLANT
PILLOW KNEE EXTENSION 0 DEG (MISCELLANEOUS) ×2 IMPLANT
PIN THREADED HEADED SIGMA (PIN) ×1 IMPLANT
PIN/DRILL PACK ORTHO 1/8X3.0 (PIN) ×2 IMPLANT
SAW OSC TIP CART 19.5X105X1.3 (SAW) ×2 IMPLANT
SET BASIN LINEN APH (SET/KITS/TRAYS/PACK) ×2 IMPLANT
SET HNDPC FAN SPRY TIP SCT (DISPOSABLE) ×1 IMPLANT
STAPLER VISISTAT 35W (STAPLE) ×2 IMPLANT
SUT BRALON NAB BRD #1 30IN (SUTURE) ×2 IMPLANT
SUT MNCRL 0 VIOLET CTX 36 (SUTURE) ×1 IMPLANT
SUT MON AB 0 CT1 (SUTURE) ×2 IMPLANT
SUT MONOCRYL 0 CTX 36 (SUTURE) ×2
SYR 20ML LL LF (SYRINGE) ×2 IMPLANT
SYR BULB IRRIG 60ML STRL (SYRINGE) ×2 IMPLANT
TOWEL OR 17X26 4PK STRL BLUE (TOWEL DISPOSABLE) ×2 IMPLANT
TOWER CARTRIDGE SMART MIX (DISPOSABLE) ×2 IMPLANT
TRAY FOLEY MTR SLVR 16FR STAT (SET/KITS/TRAYS/PACK) ×2 IMPLANT
WATER STERILE IRR 1000ML POUR (IV SOLUTION) ×4 IMPLANT
YANKAUER SUCT 12FT TUBE ARGYLE (SUCTIONS) ×2 IMPLANT

## 2020-12-25 NOTE — Brief Op Note (Signed)
12/25/2020  10:27 AM  PATIENT:  Lance Petty  65 y.o. male  PRE-OPERATIVE DIAGNOSIS:  osteoarthritis right knee  POST-OPERATIVE DIAGNOSIS:  osteoarthritis right knee  PROCEDURE:  Procedure(s): RIGHT TOTAL KNEE ARTHROPLASTY (Right)  SURGEON:  Surgeon(s) and Role:    Vickki Hearing, MD - Primary  IMPLANTS:  DePuy attune fixed-bearing posterior stabilized knee size 8 femur size 7 tibia size 5 insert size 38 x 10 patella  Distal femoral cut 9 mm Proximal tibial cut nine +2 mm equal 11 mm Patella cut 24 thickness down to 12 thickness Assisted by Valetta Close and Canary Brim  Preop anesthesia block saphenous nerve Spinal anesthetic 20 cc of full-strength exparel 5 cc medial collateral ligament 5 cc lateral collateral ligament 5 cc posterior capsule 5 cc quadriceps  Findings Very tight knee with less than 10 degree varus deformity but required posteromedial release and superficial medial collateral ligament release  EBL:  50 mL   BLOOD ADMINISTERED:none  DRAINS: none     SPECIMEN:  No Specimen  DISPOSITION OF SPECIMEN:  N/A  COUNTS:  YES  TOURNIQUET:   Total Tourniquet Time Documented: Thigh (Right) - 111 minutes Total: Thigh (Right) - 111 minutes   DICTATION: .Reubin Milan Dictation  PLAN OF CARE: Discharge to home after PACU  PATIENT DISPOSITION:  PACU - hemodynamically stable.   Delay start of Pharmacological VTE agent (>24hrs) due to surgical blood loss or risk of bleeding: yes

## 2020-12-25 NOTE — Evaluation (Signed)
Physical Therapy Evaluation Patient Details Name: Lance Petty MRN: [id number] DOB: [birth year] Today's Date: 12/25/2020  RIGHT KNEE ROM:  0 - 100 degrees AMBULATION DISTANCE: 120 feet using RW with Modified Independent/Supervision    History of Present Illness  Lance Petty  is a 65 y/o male, s/p Right TKA on 12/25/20 with the diagnosis of osteoarthritis right knee.  Clinical Impression  Patient instructed in and given written instructions for HEP with understanding acknowledged, demonstrates slightly labored movement for sitting up at bedside requiring Min to Min guard assist to help move RLE, good return for ambulation using RW without loss of balance and good return for going up down 4 steps using bilateral siderails without loss of balance.  Patient tolerated sitting up at bedside after therapy - RN aware.  Patient will benefit from continued physical therapy in hospital and recommended venue below to increase strength, balance, endurance for safe ADLs and gait.    Follow Up Recommendations Home health PT;Supervision for mobility/OOB;Supervision - Intermittent    Equipment Recommendations  Rolling walker with 5" wheels;3in1 (PT)    Recommendations for Other Services       Precautions / Restrictions Precautions Precautions: Fall Restrictions Weight Bearing Restrictions: Yes RLE Weight Bearing: Weight bearing as tolerated      Mobility  Bed Mobility Overal bed mobility: Needs Assistance Bed Mobility: Supine to Sit;Sit to Supine     Supine to sit: Min guard;Min assist Sit to supine: Supervision;Min guard   General bed mobility comments: labored movement, increased time    Transfers Overall transfer level: Needs assistance Equipment used: Rolling walker (2 wheeled) Transfers: Sit to/from [employer] Sit to Stand: Modified independent (Device/Increase time);Supervision Stand pivot transfers: Modified independent (Device/Increase time);Supervision        General transfer comment: demonstrates good return for transferring bed<>chair using RW  Ambulation/Gait Ambulation/Gait assistance: Modified independent (Device/Increase time);Supervision Gait Distance (Feet): 120 Feet Assistive device: Rolling walker (2 wheeled) Gait Pattern/deviations: Decreased step length - right;Decreased stance time - right;Decreased stride length;Antalgic Gait velocity: decreased   General Gait Details: slow slightly labored cadence with fair/good return for right heel to toe stepping without loss of balance  Stairs Stairs: Yes Stairs assistance: Supervision Stair Management: Two rails;Step to pattern Number of Stairs: 4 General stair comments: Patient demonstrates good return for going up/down 4 steps using bilateral siderails without loss of balance  Wheelchair Mobility    Modified Rankin (Stroke Patients Only)       Balance Overall balance assessment: Needs assistance Sitting-balance support: Feet supported;No upper extremity supported Sitting balance-Leahy Scale: Good Sitting balance - Comments: seated at EOB   Standing balance support: During functional activity;Bilateral upper extremity supported Standing balance-Leahy Scale: Fair Standing balance comment: fair/good using RW                             Pertinent Vitals/Pain Pain Assessment: 0-10 Pain Score: 9  Pain Location: 4/10 right knee at rest, 9/10 right knee after walking Pain Descriptors / Indicators: Sore;Discomfort Pain Intervention(s): Limited activity within patient's tolerance;Monitored during session;Repositioned    Home Living Family/patient expects to be discharged to:: Private residence Living Arrangements: Spouse/significant other Available Help at Discharge: Family;Available 24 hours/day Type of Home: House Home Access: Stairs to enter Entrance Stairs-Rails: Right;Left;Can reach both Entrance Stairs-Number of Steps: 4 Home Layout: One level Home  Equipment: Cane - single point      Prior Function Level of Independence: Independent  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, drives     Hand Dominance   Dominant Hand: Right    Extremity/Trunk Assessment   Upper Extremity Assessment Upper Extremity Assessment: Overall WFL for tasks assessed    Lower Extremity Assessment Lower Extremity Assessment: RLE deficits/detail RLE Deficits / Details: grossly -4/5 RLE Sensation: WNL RLE Coordination: WNL    Cervical / Trunk Assessment Cervical / Trunk Assessment: Normal  Communication   Communication: No difficulties  Cognition Arousal/Alertness: Awake/alert Behavior During Therapy: WFL for tasks assessed/performed Overall Cognitive Status: Within Functional Limits for tasks assessed                                        General Comments      Exercises Total Joint Exercises Ankle Circles/Pumps: Supine;5 reps;Strengthening;Right;AROM Quad Sets: Supine;5 reps;Right;Strengthening;AROM Short Arc Quad: Supine;5 reps;Right;Strengthening;AROM Heel Slides: Supine;5 reps;Strengthening;AROM Goniometric ROM: right knee: 0 - 100 degrees   Assessment/Plan    PT Assessment Patient needs continued PT services  PT Problem List Decreased strength;Decreased activity tolerance;Decreased balance;Decreased range of motion;Decreased mobility       PT Treatment Interventions DME instruction;Gait training;Stair training;Functional mobility training;Therapeutic activities;Therapeutic exercise;Balance training;Patient/family education    PT Goals (Current goals can be found in the Care Plan section)  Acute Rehab PT Goals Patient Stated Goal: return home with family to assist PT Goal Formulation: With patient Time For Goal Achievement: 12/27/20 Potential to Achieve Goals: Good    Frequency BID   Barriers to discharge        Co-evaluation               AM-PAC PT "6 Clicks" Mobility  Outcome  Measure Help needed turning from your back to your side while in a flat bed without using bedrails?: A Little Help needed moving from lying on your back to sitting on the side of a flat bed without using bedrails?: A Little Help needed moving to and from a bed to a chair (including a wheelchair)?: A Little Help needed standing up from a chair using your arms (e.g., wheelchair or bedside chair)?: A Little Help needed to walk in hospital room?: A Little Help needed climbing 3-5 steps with a railing? : A Little 6 Click Score: 18    End of Session   Activity Tolerance: Patient tolerated treatment well;Patient limited by fatigue Patient left: in bed;with call bell/phone within reach Nurse Communication: Mobility status PT Visit Diagnosis: Unsteadiness on feet (R26.81);Other abnormalities of gait and mobility (R26.89);Muscle weakness (generalized) (M62.81)    Time: 8850-2774 PT Time Calculation (min) (ACUTE ONLY): 35 min   Charges:   PT Evaluation $PT Eval Moderate Complexity: 1 Mod PT Treatments $Therapeutic Activity: 23-37 mins        3:03 PM, 12/25/20 Ocie Bob, MPT Physical Therapist with [facility] [phone] office [phone] mobile phone

## 2020-12-25 NOTE — Progress Notes (Signed)
1645 while doing patient discharge, when asked if he has a walker with him. He states he does not have a walker with him or at home. Dr. Romeo Apple notified patient is to go to [facility] and staff at [facility] is to call him for verbal order for walker. Patient states he did not get a rx for walker in pre op paper work.. patient to car via wheelchair transfers well with assistance.

## 2020-12-25 NOTE — Transfer of Care (Signed)
Immediate Anesthesia Transfer of Care Note  Patient: Lance Petty  Procedure(s) Performed: RIGHT TOTAL KNEE ARTHROPLASTY (Right Knee)  Patient Location: PACU  Anesthesia Type:Spinal  Level of Consciousness: awake, alert , oriented and drowsy  Airway & Oxygen Therapy: Patient Spontanous Breathing and Patient connected to nasal cannula oxygen  Post-op Assessment: Report given to RN and Post -op Vital signs reviewed and stable  Post vital signs: Reviewed and stable  Last Vitals:  Vitals Value Taken Time  BP 109/61 12/25/20 1018  Temp    Pulse 74 12/25/20 1020  Resp 12 12/25/20 1020  SpO2 99 % 12/25/20 1020  Vitals shown include unvalidated device data.  Last Pain:  Vitals:   12/25/20 0642  PainSc: 0-No pain         Complications: No complications documented.

## 2020-12-25 NOTE — Anesthesia Procedure Notes (Signed)
Anesthesia Regional Block: Adductor canal block   Pre-Anesthetic Checklist: ,, timeout performed, Correct Patient, Correct Site, Correct Laterality, Correct Procedure, Correct Position, site marked, Risks and benefits discussed,  Surgical consent,  Pre-op evaluation,  At surgeon's request and post-op pain management  Laterality: Right  Prep: chloraprep       Needles:  Injection technique: Single-shot  Needle Type: Echogenic Stimulator Needle     Needle Length: 10cm  Needle Gauge: 22   Needle insertion depth: 6 cm   Additional Needles:   Procedures:, nerve stimulator,,, ultrasound used (permanent image in chart),,,,   Nerve Stimulator or Paresthesia:  Response: Twitch elicited, 0.8 mA,   Additional Responses:   Narrative:  Start time: 12/25/2020 7:00 AM End time: 12/25/2020 7:06 AM  Performed by: Personally  Anesthesiologist: Molli Barrows, MD  Additional Notes: BP cuff, EKG monitors applied. Sedation begun.  After nerve location anesthetic injected incrementally, slowly , and after neg aspirations. Bupivacaine 28 ml of 0.25% and dexamethasone 8 mg was injected.  Tolerated well.

## 2020-12-25 NOTE — Interval H&P Note (Signed)
History and Physical Interval Note:  12/25/2020 7:19 AM  Lance Petty  has presented today for surgery, with the diagnosis of osteoarthritis right knee.  The various methods of treatment have been discussed with the patient and family. After consideration of risks, benefits and other options for treatment, the patient has consented to  Procedure(s): TOTAL KNEE ARTHROPLASTY (Right) as a surgical intervention.  The patient's history has been reviewed, patient examined, no change in status, stable for surgery.  I have reviewed the patient's chart and labs.  Questions were answered to the patient's satisfaction.     Fuller Canada

## 2020-12-25 NOTE — Anesthesia Preprocedure Evaluation (Signed)
Anesthesia Evaluation  Patient identified by MRN, date of birth, ID band Patient awake    Reviewed: Allergy & Precautions, NPO status , Patient's Chart, lab work & pertinent test results  History of Anesthesia Complications Negative for: history of anesthetic complications  Airway Mallampati: III  TM Distance: >3 FB Neck ROM: Full    Dental  (+) Dental Advisory Given, Missing   Pulmonary neg pulmonary ROS,    Pulmonary exam normal breath sounds clear to auscultation       Cardiovascular Exercise Tolerance: Good hypertension, Pt. on medications Normal cardiovascular exam Rhythm:Regular Rate:Normal     Neuro/Psych negative neurological ROS  negative psych ROS   GI/Hepatic negative GI ROS, Neg liver ROS,   Endo/Other  diabetes, Well Controlled, Type 2, Insulin Dependent, Oral Hypoglycemic Agents  Renal/GU   negative genitourinary   Musculoskeletal  (+) Arthritis , Osteoarthritis,    Abdominal   Peds  Hematology   Anesthesia Other Findings   Reproductive/Obstetrics                            Anesthesia Physical Anesthesia Plan  ASA: II  Anesthesia Plan: General and Spinal   Post-op Pain Management:  Regional for Post-op pain   Induction:   PONV Risk Score and Plan: 2 and Ondansetron and TIVA  Airway Management Planned: Nasal Cannula, Natural Airway and Simple Face Mask  Additional Equipment:   Intra-op Plan:   Post-operative Plan:   Informed Consent: I have reviewed the patients History and Physical, chart, labs and discussed the procedure including the risks, benefits and alternatives for the proposed anesthesia with the patient or authorized representative who has indicated his/her understanding and acceptance.     Dental advisory given  Plan Discussed with: CRNA and Surgeon  Anesthesia Plan Comments:        Anesthesia Quick Evaluation

## 2020-12-25 NOTE — Addendum Note (Signed)
Addendum  created 12/25/20 1048 by Molli Barrows, MD   Intraprocedure Meds edited

## 2020-12-25 NOTE — Progress Notes (Addendum)
Nerve block per Dr Alva Garnet. Pt tolerated well. Start 0700. Stop 0706. Time out 0700.

## 2020-12-25 NOTE — Op Note (Signed)
12/25/2020  10:27 AM  PATIENT:  Lance Petty  65 y.o. male  PRE-OPERATIVE DIAGNOSIS:  osteoarthritis right knee  POST-OPERATIVE DIAGNOSIS:  osteoarthritis right knee  PROCEDURE:  Procedure(s): RIGHT TOTAL KNEE ARTHROPLASTY (Right)  SURGEON:  Surgeon(s) and Role:    Vickki Hearing, MD - Primary  IMPLANTS:  DePuy attune fixed-bearing posterior stabilized knee size 8 femur size 7 tibia size 5 insert size 38 x 10 patella  Distal femoral cut 9 mm Proximal tibial cut nine +2 mm equal 11 mm Patella cut 24 thickness down to 12 thickness Assisted by Valetta Close and Canary Brim  Preop anesthesia block saphenous nerve Spinal anesthetic 20 cc of full-strength exparel 5 cc medial collateral ligament 5 cc lateral collateral ligament 5 cc posterior capsule 5 cc quadriceps  Findings Very tight knee with less than 10 degree varus deformity but required posteromedial release and superficial medial collateral ligament release   Details of procedure  Patient was seen in the preop area surgical site was confirmed patient was identified as Lance Petty.  Surgical site was marked and chart review and x-ray review was completed  Patient preoperative saphenous nerve block was taken to the operating for spinal anesthesia then placed supine on the operating table Foley catheter was inserted.  Tourniquet was applied to the right thigh  Knee was prepped and draped sterilely  Timeout was completed  Limb was exsanguinated with a 6 inch Esmarch tourniquet elevated 280 mmHg.  Standard midline incision was performed medial arthrotomy was used to open the joint there were not a lot of osteophytes.  ACL and PCL were resected  Medial compartment was completely denuded of cartilage.  Patella was slightly denuded of cartilage.  Tight varus deformity was noted.  Standard medial release was performed initially  Distal femur was cut with a 5 degree right 9 mm resection however the bone was measured  only 7 mm or so an additional 2 mm was resected  Tibia was then brought forward and sent for 3 mm release from the deficient medial side this removed 9 mm from the lateral tibia.  However the medial side was still tight with the extension block of 5 mm.  Further soft tissue medial release was done including the superficial medial collateral ligament and posteromedial semimembranosus  This still was not enough bone release.  I then measured the femur it measured 7-1/2 I took the size 8.  After making the additional femoral cuts I removed 2 additional millimeters from the tibia.  Tibia cut was performed using standard bone marks of medial third tibial tubercle neutral tibial shaft and 3 degree posterior slope  5 mm block was then placed in flexion and extension and the knee was then balanced  Proceeded then to cut the notch.  The patella was cut from 24 down to 12  Trial reduction was performed with 5 mm insert confirming was cut from 24 down to 12  Trial reduction was performed with 5 mm insert confirming full extension passive flexion test 105 ligament balance  The trial components were removed  The tibia was prepared  Knee was then irrigated.  The medial collateral ligament lateral collateral ligament quadriceps and posterior capsule were injected with full-strength 20 cc of exparel  The bone was irrigated and dried cementing was performed excess cement was removed.  Knee range of motion match trial reduction  Final irrigation was performed knee was closed with 2 #1 Braylon's in interrupted fashion for the extensor mechanism and 2 layers  of 0 Monocryl for the skin followed by staples  Sterile dressing was applied  Cryo/Cuff was applied  Patient was taken recovery room in stable condition  Plan is for same-day surgery protocol  EBL:  50 mL   BLOOD ADMINISTERED:none  DRAINS: none     SPECIMEN:  No Specimen  DISPOSITION OF SPECIMEN:  N/A  COUNTS:  YES  TOURNIQUET:    Total Tourniquet Time Documented: Thigh (Right) - 111 minutes Total: Thigh (Right) - 111 minutes   DICTATION: .Reubin Milan Dictation  PLAN OF CARE: Discharge to home after PACU  PATIENT DISPOSITION:  PACU - hemodynamically stable.   Delay start of Pharmacological VTE agent (>24hrs) due to surgical blood loss or risk of bleeding: yes

## 2020-12-25 NOTE — Anesthesia Postprocedure Evaluation (Signed)
Anesthesia Post Note  Patient: Panfilo Ketchum  Procedure(s) Performed: RIGHT TOTAL KNEE ARTHROPLASTY (Right Knee)  Patient location during evaluation: PACU Anesthesia Type: Spinal Level of consciousness: awake, oriented, awake and alert and patient cooperative Pain management: satisfactory to patient Vital Signs Assessment: post-procedure vital signs reviewed and stable Respiratory status: spontaneous breathing, nonlabored ventilation and respiratory function stable Cardiovascular status: stable Postop Assessment: no apparent nausea or vomiting Anesthetic complications: no   No complications documented.   Last Vitals:  Vitals:   12/25/20 0700 12/25/20 0715  BP: 135/72 127/67  Pulse:    Resp: 14 13  Temp:    SpO2: 100%     Last Pain:  Vitals:   12/25/20 0642  PainSc: 0-No pain                 Camaron Cammack

## 2020-12-25 NOTE — Progress Notes (Signed)
Instructed on incentive spirometer. 1750 ml obtained. Tolerated well. 

## 2020-12-25 NOTE — Anesthesia Procedure Notes (Signed)
Spinal  Patient location during procedure: OR Start time: 12/25/2020 7:34 AM End time: 12/25/2020 7:47 AM Staffing Performed: anesthesiologist  Anesthesiologist: Molli Barrows, MD Preanesthetic Checklist Completed: patient identified, IV checked, site marked, risks and benefits discussed, surgical consent, monitors and equipment checked, pre-op evaluation and timeout performed Spinal Block Patient position: sitting Prep: Betadine and DuraPrep Patient monitoring: heart rate Approach: midline Location: L3-4 Injection technique: single-shot Needle Needle type: Spinocan  Needle gauge: 24 G Needle length: 9 cm Needle insertion depth: 8 cm

## 2020-12-25 NOTE — Plan of Care (Signed)
  Problem: Acute Rehab PT Goals(only PT should resolve) Goal: Pt Will Go Supine/Side To Sit Outcome: Progressing Flowsheets (Taken 12/25/2020 1503) Pt will go Supine/Side to Sit: with modified independence Goal: Patient Will Transfer Sit To/From Stand Outcome: Progressing Flowsheets (Taken 12/25/2020 1503) Patient will transfer sit to/from stand: with modified independence Goal: Pt Will Transfer Bed To Chair/Chair To Bed Outcome: Progressing Flowsheets (Taken 12/25/2020 1503) Pt will Transfer Bed to Chair/Chair to Bed: with modified independence Goal: Pt Will Ambulate Outcome: Progressing Flowsheets (Taken 12/25/2020 1503) Pt will Ambulate:  > 125 feet  with modified independence  with rolling walker   3:04 PM, 12/25/20 Ocie Bob, MPT Physical Therapist with [facility] [phone] office [phone] mobile phone

## 2020-12-25 NOTE — Brief Op Note (Signed)
12/25/2020  10:25 AM  PATIENT:  Lance Petty  65 y.o. male  PRE-OPERATIVE DIAGNOSIS:  osteoarthritis right knee  POST-OPERATIVE DIAGNOSIS:  osteoarthritis right knee  PROCEDURE:  Procedure(s): RIGHT TOTAL KNEE ARTHROPLASTY (Right)  SURGEON:  Surgeon(s) and Role:    Vickki Hearing, MD - Primary  IMPLANTS:  DePuy attune fixed-bearing posterior stabilized knee size 8 femur size 7 tibia size 5 insert size 38 x 10 patella  Distal femoral cut 9 mm Proximal tibial cut nine +2 mm equal 11 mm Patella cut 24 thickness down to 12 thickness Assisted by Valetta Close and Canary Brim  Preop anesthesia block saphenous nerve Spinal anesthetic 20 cc of full-strength exparel 5 cc medial collateral ligament 5 cc lateral collateral ligament 5 cc posterior capsule 5 cc quadriceps  Findings Very tight knee with less than 10 degree varus deformity but required posteromedial release and superficial medial collateral ligament release  EBL:  50 mL   BLOOD ADMINISTERED:none  DRAINS: none     SPECIMEN:  No Specimen  DISPOSITION OF SPECIMEN:  N/A  COUNTS:  YES  TOURNIQUET:   Total Tourniquet Time Documented: Thigh (Right) - 111 minutes Total: Thigh (Right) - 111 minutes   DICTATION: .Reubin Milan Dictation  PLAN OF CARE: Discharge to home after PACU  PATIENT DISPOSITION:  PACU - hemodynamically stable.   Delay start of Pharmacological VTE agent (>24hrs) due to surgical blood loss or risk of bleeding: yes

## 2020-12-25 NOTE — Discharge Instructions (Signed)
Spinal Anesthesia and Epidural Anesthesia, Care After This sheet gives you information about how to care for yourself after your procedure. Your doctor may also give you more specific instructions. If you have problems or questions, contact your doctor. What can I expect after the procedure? While the medicines you were given are still having effects, it is common to:  Feel like you may vomit (nauseous).  Vomit.  Have a numb feeling or tingling in your legs.  Have problems when you pee (urinate).  Feel itchy. Follow these instructions at home: For the time period you were told by your doctor:  Rest.  Do not do activities where you could fall or get hurt.  Do not drive or use machines.  Do not drink alcohol.  Do not take sleeping pills or medicines that make you drowsy.  Do not make big decisions or sign legal documents.  Do not take care of children on your own.   Eating and drinking  If you vomit, wait a short time. When you can drink without vomiting, try to drink some water, juice, or clear soup.  Drink enough fluid to keep your pee (urine) pale yellow.  Make sure you do not feel like vomiting before you eat solid foods.  Follow the diet that your doctor recommends. General instructions  If you have sleep apnea, surgery and certain medicines can raise your risk for breathing problems. Follow instructions from your doctor about when to wear your sleep device.  Have a responsible adult stay with you for the time you are told. It is important to have someone help care for you until you are awake and alert.  Return to your normal activities as told by your doctor. Ask your doctor what activities are safe for you.  Take over-the-counter and prescription medicines only as told by your doctor.  Do not use any products that contain nicotine or tobacco, such as cigarettes, e-cigarettes, and chewing tobacco. If you need help quitting, ask your doctor.  Keep all follow-up  visits as told by your doctor. This is important. Contact a doctor if:  It has been more than one day since your procedure and you feel like you may vomit or you are vomiting.  You have a rash. Get help right away if:  You have a fever.  You have a headache that lasts a long time.  You have a very bad headache.  Your vision is blurry or you see two of a single object (double vision).  You are dizzy or light-headed.  You faint.  Your arms or legs tingle, feel weak, or get numb.  You have trouble breathing.  You cannot pee. These symptoms may be an emergency. Get help right away. Call your local emergency services (911 in the [place]  Do not wait to see if the symptoms will go away.  Do not drive yourself to the hospital. Summary  After the procedure, have a responsible adult stay with you at home.  Do not do activities that might cause you to get hurt.  Do not drive, use machinery, drink alcohol, or make important decisions as told by your doctor.  Do not use products that contain nicotine or tobacco.  Get help right away if you have a very bad headache, trouble breathing, or you cannot pee. This information is not intended to replace advice given to you by your health care provider. Make sure you discuss any questions you have with your health care provider. Document Revised: 08/16/2020   Document Reviewed: 01/19/2020 Elsevier Patient Education  2021 Elsevier Inc.  

## 2020-12-27 ENCOUNTER — Telehealth: Payer: Self-pay | Admitting: Orthopedic Surgery

## 2020-12-27 ENCOUNTER — Encounter (HOSPITAL_COMMUNITY): Payer: Self-pay | Admitting: Orthopedic Surgery

## 2020-12-27 NOTE — Telephone Encounter (Signed)
Patient called this morning stating that he has been told that he would have to pay 200.00 a week for the CPM.  He said he doesn't have that kind of money.  I told him that we didn't have control of whether or not the [place] pays for the equipment.  I gave him the contact number to the [place] that we had on his acceptance letter which is Psych[facility], prison and probation services.  He states he will call her.  I then spoke to Amy Littrell of this office.  She said she did fax the order for the CPM to [facility] and she is aware that he needs this.  Amy further said that if Mr. Dieudonne didn't want to wait for the [facility] to give approval, he can always go ahead and pay for the CPM and then get reimbursed.

## 2020-12-28 LAB — TYPE AND SCREEN
ABO/RH(D): O POS
Antibody Screen: NEGATIVE
Unit division: 0
Unit division: 0

## 2020-12-28 LAB — BPAM RBC
Blood Product Expiration Date: 202202112359
Blood Product Expiration Date: 202202132359
Unit Type and Rh: 5100
Unit Type and Rh: 5100

## 2021-01-09 ENCOUNTER — Other Ambulatory Visit: Payer: Self-pay

## 2021-01-09 ENCOUNTER — Ambulatory Visit (INDEPENDENT_AMBULATORY_CARE_PROVIDER_SITE_OTHER): Payer: No Typology Code available for payment source | Admitting: Orthopedic Surgery

## 2021-01-09 ENCOUNTER — Encounter: Payer: Self-pay | Admitting: Orthopedic Surgery

## 2021-01-09 DIAGNOSIS — Z96651 Presence of right artificial knee joint: Secondary | ICD-10-CM

## 2021-01-09 MED ORDER — OXYCODONE HCL 10 MG PO TABS: 10.0000 mg | ORAL_TABLET | Freq: Four times a day (QID) | ORAL | 0 refills | Status: AC

## 2021-01-09 NOTE — Progress Notes (Signed)
POV # 1   S/P RIGHT TKA   PRE-OPERATIVE DIAGNOSIS:  osteoarthritis right knee   POST-OPERATIVE DIAGNOSIS:  osteoarthritis right knee   PROCEDURE:  Procedure(s): RIGHT TOTAL KNEE ARTHROPLASTY (Right)     IMPLANTS:  DePuy attune fixed-bearing posterior stabilized knee size 8 femur size 7 tibia size 5 insert size 38 x 10 patella   Distal femoral cut 9 mm Proximal tibial cut nine +2 mm equal 11 mm Patella cut 24 thickness down to 12 thickness Assisted by Valetta Close and Canary Brim     Findings Very tight knee with less than 10 degree varus deformity but required posteromedial release and superficial medial collateral ligament release  HE S DOING WELL   I REFILLED HIS OXYCODONE BACK TO PREOP LEVELS   INCX CLEAN  ROM 5-100  FU 3 WEEKS   START PT OUTPX   Encounter Diagnosis  Name Primary?  . Status post total right knee replacement Yes   Meds ordered this encounter  Medications  . Oxycodone HCl 10 MG TABS    Sig: Take 1 tablet (10 mg total) by mouth 4 (four) times daily for 7 days.    Dispense:  28 tablet    Refill:  0

## 2021-01-16 ENCOUNTER — Ambulatory Visit (HOSPITAL_COMMUNITY): Payer: No Typology Code available for payment source | Attending: Orthopedic Surgery

## 2021-01-16 ENCOUNTER — Other Ambulatory Visit: Payer: Self-pay

## 2021-01-16 ENCOUNTER — Encounter (HOSPITAL_COMMUNITY): Payer: Self-pay

## 2021-01-16 DIAGNOSIS — R262 Difficulty in walking, not elsewhere classified: Secondary | ICD-10-CM | POA: Insufficient documentation

## 2021-01-16 DIAGNOSIS — M6281 Muscle weakness (generalized): Secondary | ICD-10-CM | POA: Diagnosis present

## 2021-01-16 NOTE — Therapy (Signed)
[facility] [address] [place], [place], [address] Phone: [phone]   Fax:  [phone]  Physical Therapy Evaluation  Patient Details  Name: Lance Petty MRN: [id number] Date of Birth: 1956-08-04 Referring Provider (PT): Ty Hilts, MD   Encounter Date: 01/16/2021   PT End of Session - 01/16/21 0939    Visit Number 1    Number of Visits 15    Date for PT Re-Evaluation 03/06/21    Authorization Type [facility], 15 approved visits    Authorization - Visit Number 1    Authorization - Number of Visits 15    Progress Note Due on Visit 10    PT Start Time [phone]    PT Stop Time 1020    PT Time Calculation (min) 37 min    Activity Tolerance Patient tolerated treatment well    Behavior During Therapy [facility] for tasks assessed/performed           Past Medical History:  Diagnosis Date  . Arthritis   . Diabetes mellitus without complication (HCC)   . Hypercholesteremia   . Hypertension   . Hypertension     Past Surgical History:  Procedure Laterality Date  . KNEE SURGERY    . TONSILLECTOMY    . TOTAL KNEE ARTHROPLASTY Right 12/25/2020   Procedure: RIGHT TOTAL KNEE ARTHROPLASTY;  Surgeon: Vickki Hearing, MD;  Location: [facility];  Service: Orthopedics;  Laterality: Right;    There were no vitals filed for this visit.    Subjective Assessment - 01/16/21 1015    Subjective Pt underwent right TKR on 12/25/20 and presents to outpatient therapy with limited ROM and difficulty in walking.  Prior to surgery pt was independent in all aspects and ambulatory without use of cane and lived very active life.  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 Yes    Pain Score 2     Pain Location Knee    Pain Orientation Right    Pain Descriptors / Indicators Aching    Pain Type  Surgical pain    Pain Onset 1 to 4 weeks ago    Pain Frequency Intermittent    Aggravating Factors  activity    Pain Relieving Factors rest, ice, CPM              [facility] PT Assessment - 01/16/21 0001      Assessment   Medical Diagnosis R TKR    Referring Provider (PT) Ty Hilts, MD    Onset Date/Surgical Date 12/25/20    Next MD Visit 01/30/21    Prior Therapy Home Health therapy      Balance Screen   Has the patient fallen in the past 6 months No    Has the patient had a decrease in activity level because of a fear of falling?  No    Is the patient reluctant to leave their home because of a fear of falling?  No      Home Nurse, mental health Private residence    Living Arrangements Spouse/significant other    Available Help at Discharge Family    Type of Home House    Home Access Stairs to enter    Entrance Stairs-Number of Steps 4    Entrance Stairs-Rails Right  Home Layout One level    Home Equipment Walker - 2 wheels;Gilmer Mor - single point      Prior Function   Level of Independence Independent    Vocation Retired    Pharmacist, community care      Observation/Other [employer] on Therapeutic Outcomes (FOTO)  47% function      ROM / Strength   AROM / PROM / Strength AROM;Strength      AROM   AROM Assessment Site Knee    Right/Left Knee Right;Left    Right Knee Extension 10   lacking   Right Knee Flexion 90    Left Knee Extension 0    Left Knee Flexion 125      Strength   Strength Assessment Site Knee    Right/Left Knee Right;Left    Right Knee Flexion 3+/5    Right Knee Extension 3-/5    Left Knee Flexion 4/5    Left Knee Extension 5/5      Transfers   Transfers Sit to Stand    Sit to Stand 6: Modified independent (Device/Increase time)      Ambulation/Gait   Ambulation/Gait Yes    Ambulation/Gait Assistance 6: Modified independent (Device/Increase time)    Ambulation Distance (Feet) 180 Feet    Assistive device  Straight cane    Gait Pattern Step-through pattern;Antalgic    Ambulation Surface Level    Gait velocity decreased    Stairs Yes    Stairs Assistance 6: Modified independent (Device/Increase time)    Stair Management Technique Two rails;Step to pattern    Number of Stairs 4    Height of Stairs 6    Gait Comments                      Objective measurements completed on examination: See above findings.       [facility] Adult PT Treatment/Exercise - 01/16/21 0001      Exercises   Exercises Knee/Hip      Knee/Hip Exercises: Supine   Quad Sets Strengthening;Right;20 reps    Heel Slides AAROM;Right;20 reps    Terminal Knee Extension Strengthening;AROM;Right;20 reps      Knee/Hip Exercises: Prone   Prone Knee Hang 1 minute                  PT Education - 01/16/21 1017    Education Details pt education on POC details and benefit of PT and importance of extension ROM. HEP education    Person(s) Educated Patient    Methods Explanation;Handout    Comprehension Verbalized understanding;Returned demonstration;Need further instruction            PT Short Term Goals - 01/16/21 1024      PT SHORT TERM GOAL #1   Title Patient will be independent with HEP in order to improve functional outcomes.    Time 3    Period Weeks    Status New    Target Date 02/06/21      PT SHORT TERM GOAL #2   Title Patient will demo right knee extension to lacking 3 degrees and right knee flexion to 110 degrees to improve transfer and gait kinematics    Baseline lackng 10 degrees extension, 90 degrees flexoin    Time 3    Period Weeks    Status New    Target Date 02/06/21      PT SHORT TERM GOAL #3   Title Patient will be able to  ambulate at least 300 feet in in order to demonstrate improved gait speed for community ambulation.    Baseline 180 with cane    Time 3    Period Weeks    Status New    Target Date 02/06/21             PT Long Term Goals - 01/16/21  1026      PT LONG TERM GOAL #1   Title Patient will improve on FOTO score to meet predicted outcomes to improve functional independence    Baseline 47% function    Time 7    Period Weeks    Status New    Target Date 03/06/21      PT LONG TERM GOAL #2   Title Patient will be able to ascend/descend 4 stairs with HR and reciprocal pattern    Baseline step to step    Time 7    Period Weeks    Status New    Target Date 03/06/21                  Plan - 01/16/21 1019    Clinical Impression Statement Patient is a  65 yo male presenting to physical therapy with  difficulty walking s/p right TKR. He presents with pain limited deficits in right knee strength, ROM, endurance and functional mobility with ADL. He is having to modify and restrict ADL as indicated by FOTO score as well as subjective information and objective measures which is affecting overall participation. Patient will benefit from skilled physical therapy in order to improve function and reduce impairment.    Personal Factors and Comorbidities Time since onset of injury/illness/exacerbation    Examination-Activity Limitations Carry;Lift;Stand;Stairs;Squat;Sit;Locomotion Level;Transfers    Examination-Participation Restrictions Cleaning;Community Activity;Laundry;Yard Work;Occupation    Stability/Clinical Decision Making Stable/Uncomplicated    Clinical Decision Making Low    Rehab Potential Excellent    PT Frequency 2x / week    PT Duration --   7 weeks   PT Treatment/Interventions ADLs/Self Care Home Management;Aquatic Therapy;Cryotherapy;Electrical Stimulation;DME Instruction;Gait training;Stair training;Functional mobility training;Therapeutic activities;Therapeutic exercise;Balance training;Patient/family education;Neuromuscular re-education;Manual techniques;Passive range of motion;Taping;Energy conservation;Dry needling;Spinal Manipulations;Joint Manipulations    PT Next Visit Plan Continue with LE ROM/PRE exercises,  manual therapy for right knee extension    PT Home Exercise Plan QS, knee ext mob, heel slides, side stepping, prone knee hang           Patient will benefit from skilled therapeutic intervention in order to improve the following deficits and impairments:  Abnormal gait,Decreased activity tolerance,Decreased mobility,Decreased knowledge of use of DME,Decreased endurance,Decreased range of motion,Decreased strength,Difficulty walking,Improper body mechanics,Pain  Visit Diagnosis: Difficulty in walking, not elsewhere classified  Muscle weakness (generalized)     Problem List Patient Active Problem List   Diagnosis Date Noted  . Primary osteoarthritis of right knee     10:28 AM, 01/16/21 M. Shary Decamp, PT, DPT Physical Therapist- [place] Office Number: [phone]  [facility] [address] [place], [place], [address] Phone: [phone]   Fax:  [phone]  Name: Lance Petty MRN: [id number] Date of Birth: 1956-07-14

## 2021-01-16 NOTE — Patient Instructions (Signed)
Access Code: [email or web address] URL: [email or web address] Date: 01/16/2021 Prepared by: Shary Decamp  Exercises Supine Quad Set - 1 x daily - 7 x weekly - 3 sets - 10 reps - 2 sec hold Long Sitting Quad Set with Towel Roll Under Heel - 1 x daily - 7 x weekly - 3 sets - 10 reps - 2 sec hold Prone Knee Extension Hang - 1 x daily - 7 x weekly - 3 sets - 3 reps - 3-5 min hold Side Stepping with Counter Support - 1 x daily - 7 x weekly - 3 sets

## 2021-01-18 ENCOUNTER — Other Ambulatory Visit: Payer: Self-pay

## 2021-01-18 ENCOUNTER — Ambulatory Visit (HOSPITAL_COMMUNITY): Payer: No Typology Code available for payment source

## 2021-01-18 ENCOUNTER — Encounter (HOSPITAL_COMMUNITY): Payer: Self-pay

## 2021-01-18 DIAGNOSIS — R262 Difficulty in walking, not elsewhere classified: Secondary | ICD-10-CM | POA: Diagnosis not present

## 2021-01-18 DIAGNOSIS — M6281 Muscle weakness (generalized): Secondary | ICD-10-CM

## 2021-01-18 NOTE — Therapy (Signed)
[facility] [address] [place], [place], [address] Phone: [phone]   Fax:  [phone]  Physical Therapy Treatment  Patient Details  Name: Lance Petty MRN: [id number] Date of Birth: 10-08-1956 Referring Provider (PT): Ty Hilts, MD   Encounter Date: 01/18/2021   PT End of Session - 01/18/21 1306    Visit Number 2    Number of Visits 15    Date for PT Re-Evaluation 03/06/21    Authorization Type [facility], 15 approved visits    Authorization - Visit Number 2    Authorization - Number of Visits 15    Progress Note Due on Visit 10    PT Start Time 1300    PT Stop Time 1345    PT Time Calculation (min) 45 min    Activity Tolerance Patient tolerated treatment well    Behavior During Therapy [facility] for tasks assessed/performed           Past Medical History:  Diagnosis Date  . Arthritis   . Diabetes mellitus without complication (HCC)   . Hypercholesteremia   . Hypertension   . Hypertension     Past Surgical History:  Procedure Laterality Date  . KNEE SURGERY    . TONSILLECTOMY    . TOTAL KNEE ARTHROPLASTY Right 12/25/2020   Procedure: RIGHT TOTAL KNEE ARTHROPLASTY;  Surgeon: Vickki Hearing, MD;  Location: [facility];  Service: Orthopedics;  Laterality: Right;    There were no vitals filed for this visit.   Subjective Assessment - 01/18/21 1304    Subjective Presents to therapy session with increased right knee swelling/edema located mainly in anterior knee. No injury reported and only new addition being exercises from outpatient therapy visit the other day.    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    Pain Onset 1 to 4 weeks ago              [facility] PT Assessment - 01/18/21 0001      Observation/Other Assessments-Edema    Edema Circumferential      Circumferential Edema   Circumferential - Right 16.75 inches right knee      AROM   Right Knee Extension 8    lacking   Right Knee Flexion 92                         [facility] Adult PT Treatment/Exercise - 01/18/21 0001      Knee/Hip Exercises: Supine   Quad Sets Strengthening;Right;20 reps    Knee Extension AAROM;Right   3x2 min using green stability ball and oscilitory bounce   Knee Flexion AAROM;Right   3x2 min using green stability ball and oscilitory bounce     Manual Therapy   Manual Therapy Edema management    Manual therapy comments edema mgmt performed to right knee to reduce edema performing effluerage to improve fluid mobility and demonstrates 16" circumference after procedure                  PT Education - 01/18/21 1348    Education Details educated on scaling back his exercise frequency/duration to reduce incidence of right knee edema and use of ice/elevation to reduce edema    Person(s) Educated Patient    Methods Explanation    Comprehension Verbalized understanding            PT Short Term Goals - 01/16/21 1024  PT SHORT TERM GOAL #1   Title Patient will be independent with HEP in order to improve functional outcomes.    Time 3    Period Weeks    Status New    Target Date 02/06/21      PT SHORT TERM GOAL #2   Title Patient will demo right knee extension to lacking 3 degrees and right knee flexion to 110 degrees to improve transfer and gait kinematics    Baseline lackng 10 degrees extension, 90 degrees flexoin    Time 3    Period Weeks    Status New    Target Date 02/06/21      PT SHORT TERM GOAL #3   Title Patient will be able to ambulate at least 300 feet in in order to demonstrate improved gait speed for community ambulation.    Baseline 180 with cane    Time 3    Period Weeks    Status New    Target Date 02/06/21             PT Long Term Goals - 01/16/21 1026      PT LONG TERM GOAL #1   Title Patient will improve on FOTO score to meet predicted outcomes to improve functional independence    Baseline 47% function     Time 7    Period Weeks    Status New    Target Date 03/06/21      PT LONG TERM GOAL #2   Title Patient will be able to ascend/descend 4 stairs with HR and reciprocal pattern    Baseline step to step    Time 7    Period Weeks    Status New    Target Date 03/06/21                  Patient will benefit from skilled therapeutic intervention in order to improve the following deficits and impairments:     Visit Diagnosis: Difficulty in walking, not elsewhere classified  Muscle weakness (generalized)     Problem List Patient Active Problem List   Diagnosis Date Noted  . Primary osteoarthritis of right knee     1:51 PM, 01/18/21 M. Shary Decamp, PT, DPT Physical Therapist- [place] Office Number: [phone]  [facility] [address] [place], [place], [address] Phone: [phone]   Fax:  [phone]  Name: Lance Petty MRN: [id number] Date of Birth: 07-01-1956

## 2021-01-21 ENCOUNTER — Ambulatory Visit (HOSPITAL_COMMUNITY): Payer: No Typology Code available for payment source

## 2021-01-23 ENCOUNTER — Encounter (HOSPITAL_COMMUNITY): Payer: Self-pay

## 2021-01-23 ENCOUNTER — Other Ambulatory Visit: Payer: Self-pay

## 2021-01-23 ENCOUNTER — Ambulatory Visit (HOSPITAL_COMMUNITY): Payer: No Typology Code available for payment source

## 2021-01-23 DIAGNOSIS — R262 Difficulty in walking, not elsewhere classified: Secondary | ICD-10-CM | POA: Diagnosis not present

## 2021-01-23 DIAGNOSIS — M6281 Muscle weakness (generalized): Secondary | ICD-10-CM

## 2021-01-23 NOTE — Therapy (Signed)
[facility] [address] [place], [place], [address] Phone: [phone]   Fax:  [phone]  Physical Therapy Treatment  Patient Details  Name: Lance Petty MRN: [id number] Date of Birth: June 02, 1956 Referring Provider (PT): Ty Hilts, MD   Encounter Date: 01/23/2021   PT End of Session - 01/23/21 0926    Visit Number 3    Number of Visits 15    Date for PT Re-Evaluation 03/06/21    Authorization Type [facility] [employer], 15 approved visits    Authorization - Visit Number 3    Authorization - Number of Visits 15    Progress Note Due on Visit 10    PT Start Time 0901    PT Stop Time 0945    PT Time Calculation (min) 44 min    Activity Tolerance Patient tolerated treatment well    Behavior During Therapy [facility] for tasks assessed/performed           Past Medical History:  Diagnosis Date  . Arthritis   . Diabetes mellitus without complication (HCC)   . Hypercholesteremia   . Hypertension   . Hypertension     Past Surgical History:  Procedure Laterality Date  . KNEE SURGERY    . TONSILLECTOMY    . TOTAL KNEE ARTHROPLASTY Right 12/25/2020   Procedure: RIGHT TOTAL KNEE ARTHROPLASTY;  Surgeon: Vickki Hearing, MD;  Location: [facility];  Service: Orthopedics;  Laterality: Right;    There were no vitals filed for this visit.   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 Yes    Pain Score 4     Pain Location Knee    Pain Orientation Right    Pain Descriptors / Indicators Aching    Pain Onset 1 to 4 weeks ago              [facility] PT Assessment - 01/23/21 0001      Assessment   Medical Diagnosis R TKR    Referring Provider (PT) Ty Hilts, MD    Onset Date/Surgical Date  12/25/20      AROM   Right Knee Extension 12   lacking   Right Knee Flexion 100                         [facility] Adult PT Treatment/Exercise - 01/23/21 0001      Knee/Hip Exercises: Aerobic   Recumbent Bike x6 min for AAROM flexion      Knee/Hip Exercises: Standing   Hip Flexion Stengthening;Both    Hip Flexion Limitations 2 min, 5 lbs stair taps    Forward Lunges 10 reps    Forward Lunges Limitations foot elevated on 6" step performing right knee flexion-extension    Other Standing Knee Exercises side stepping 5 lbs x 2 min      Knee/Hip Exercises: Seated   Long Arc Quad Strengthening;Both;3 sets;10 reps    Long Arc Quad Weight 5 lbs.      Knee/Hip Exercises: Supine   Quad Sets Strengthening;Right;3 sets;10 reps    Heel Slides AAROM;Right;3 sets;10 reps    Knee Extension AAROM;Right    Other Supine Knee/Hip Exercises manual resisted hip/knee flexion with RLE elevated on  green stability ball 3x10                  PT Education - 01/23/21 0919    Education Details education on HEP additions to improve knee ROM    Person(s) Educated Patient    Methods Explanation    Comprehension Verbalized understanding            PT Short Term Goals - 01/16/21 1024      PT SHORT TERM GOAL #1   Title Patient will be independent with HEP in order to improve functional outcomes.    Time 3    Period Weeks    Status New    Target Date 02/06/21      PT SHORT TERM GOAL #2   Title Patient will demo right knee extension to lacking 3 degrees and right knee flexion to 110 degrees to improve transfer and gait kinematics    Baseline lackng 10 degrees extension, 90 degrees flexoin    Time 3    Period Weeks    Status New    Target Date 02/06/21      PT SHORT TERM GOAL #3   Title Patient will be able to ambulate at least 300 feet in in order to demonstrate improved gait speed for community ambulation.    Baseline 180 with cane    Time 3    Period Weeks    Status  New    Target Date 02/06/21             PT Long Term Goals - 01/16/21 1026      PT LONG TERM GOAL #1   Title Patient will improve on FOTO score to meet predicted outcomes to improve functional independence    Baseline 47% function    Time 7    Period Weeks    Status New    Target Date 03/06/21      PT LONG TERM GOAL #2   Title Patient will be able to ascend/descend 4 stairs with HR and reciprocal pattern    Baseline step to step    Time 7    Period Weeks    Status New    Target Date 03/06/21                 Plan - 01/23/21 0930    Clinical Impression Statement Patient demonstrates improved knee flexion ROM and decreased edema appreciated today with flexion of 100 degrees during activities and lacing 12 degrees for terminal knee extension with presence of edema still being a limitation.  Demonstrates good activity tolerance and demo good recall of initial HEP activities for right knee ROM and strength. Continued services indicated to improve right knee ROM, strength, and activity tolerance to normalize gait and restore function to PLOF.    Personal Factors and Comorbidities Time since onset of injury/illness/exacerbation    Examination-Activity Limitations Carry;Lift;Stand;Stairs;Squat;Sit;Locomotion Level;Transfers    Examination-Participation Restrictions Cleaning;Community Activity;Laundry;Yard Work;Occupation    Stability/Clinical Decision Making Stable/Uncomplicated    Rehab Potential Excellent    PT Frequency 2x / week    PT Duration --   7 weeks   PT Treatment/Interventions ADLs/Self Care Home Management;Aquatic Therapy;Cryotherapy;Electrical Stimulation;DME Instruction;Gait training;Stair training;Functional mobility training;Therapeutic activities;Therapeutic exercise;Balance training;Patient/family education;Neuromuscular re-education;Manual techniques;Passive range of motion;Taping;Energy conservation;Dry needling;Spinal Manipulations;Joint Manipulations    PT  Next Visit Plan Continue with LE ROM/PRE exercises, manual therapy for right knee extension    PT Home Exercise Plan QS, knee ext mob, heel slides, side stepping, prone knee  hang           Patient will benefit from skilled therapeutic intervention in order to improve the following deficits and impairments:  Abnormal gait,Decreased activity tolerance,Decreased mobility,Decreased knowledge of use of DME,Decreased endurance,Decreased range of motion,Decreased strength,Difficulty walking,Improper body mechanics,Pain  Visit Diagnosis: Difficulty in walking, not elsewhere classified  Muscle weakness (generalized)     Problem List Patient Active Problem List   Diagnosis Date Noted  . Primary osteoarthritis of right knee     9:54 AM, 01/23/21 M. Shary Decamp, PT, DPT Physical Therapist- [place] Office Number: [phone]  [facility] [address] [place], [place], [address] Phone: [phone]   Fax:  [phone]  Name: Lance Petty MRN: [id number] Date of Birth: 03-26-1956

## 2021-01-25 ENCOUNTER — Ambulatory Visit (HOSPITAL_COMMUNITY): Payer: No Typology Code available for payment source | Admitting: Physical Therapy

## 2021-01-25 ENCOUNTER — Other Ambulatory Visit: Payer: Self-pay

## 2021-01-25 ENCOUNTER — Encounter (HOSPITAL_COMMUNITY): Payer: Self-pay | Admitting: Physical Therapy

## 2021-01-25 DIAGNOSIS — R262 Difficulty in walking, not elsewhere classified: Secondary | ICD-10-CM | POA: Diagnosis not present

## 2021-01-25 DIAGNOSIS — M6281 Muscle weakness (generalized): Secondary | ICD-10-CM

## 2021-01-25 NOTE — Therapy (Signed)
[facility] [address] [place], [place], [address] Phone: [phone]   Fax:  [phone]  Physical Therapy Treatment  Patient Details  Name: Lance Petty MRN: [id number] Date of Birth: [birth year] Referring Provider (PT): Ty Hilts, MD   Encounter Date: 01/25/2021   PT End of Session - 01/25/21 0901    Visit Number 4    Number of Visits 15    Date for PT Re-Evaluation 03/06/21    Authorization Type [facility] [employer], 15 approved visits    Authorization - Visit Number 4    Authorization - Number of Visits 15    Progress Note Due on Visit 10    PT Start Time [phone]    PT Stop Time 0956    PT Time Calculation (min) 40 min    Activity Tolerance Patient tolerated treatment well    Behavior During Therapy [facility] for tasks assessed/performed           Past Medical History:  Diagnosis Date  . Arthritis   . Diabetes mellitus without complication (HCC)   . Hypercholesteremia   . Hypertension   . Hypertension     Past Surgical History:  Procedure Laterality Date  . KNEE SURGERY    . TONSILLECTOMY    . TOTAL KNEE ARTHROPLASTY Right 12/25/2020   Procedure: RIGHT TOTAL KNEE ARTHROPLASTY;  Surgeon: Vickki Hearing, MD;  Location: [facility];  Service: Orthopedics;  Laterality: Right;    There were no vitals filed for this visit.   Subjective Assessment - 01/25/21 0922    Subjective States that he has been elevating his leg more, just gets tight at times. STates no difficulties with exercises.    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 Yes    Pain Score 3     Pain Location Knee    Pain Orientation Right    Pain Descriptors / Indicators Tightness    Pain Type Surgical pain    Pain Onset 1 to 4 weeks ago              [facility] PT Assessment - 01/25/21 0001      Assessment   Medical Diagnosis R TKR    Referring Provider (PT) Ty Hilts, MD    Onset  Date/Surgical Date 12/25/20    Next MD Visit 01/30/21                         [facility] Adult PT Treatment/Exercise - 01/25/21 0001      Ambulation/Gait   Ambulation/Gait Yes    Ambulation/Gait Assistance 6: Modified independent (Device/Increase time)    Ambulation Distance (Feet) 194 Feet    Assistive device Straight cane    Gait Pattern Step-through pattern;Decreased stance time - right;Decreased stride length;Decreased hip/knee flexion - right;Decreased dorsiflexion - right    Ambulation Surface Level    Gait Comments      Knee/Hip Exercises: Stretches   Gastroc Stretch Right;3 reps   2 minute holds with mobilization belts     Knee/Hip Exercises: Standing   Hip Flexion Right;5 reps;2 sets   in between TKE sets at wall   Terminal Knee Extension AROM;Right;2 sets;10 reps   5" holds at wall with ball behind knee   Other Standing Knee Exercises butt kicks 4x5 R in between SLS reps; RSLS 15" holds - bilateral upper extremity assist  Knee/Hip Exercises: Seated   Long Arc Quad Strengthening;Right;2 sets;10 reps   with DF and 2" holds     Knee/Hip Exercises: Supine   Quad Sets Strengthening;Right;1 set;15 reps   10 second holds   Heel Slides AROM;Right;1 set;15 reps   5" holds in flexion   Knee Extension AAROM;Right    Knee Extension Limitations 12   lacking   Knee Flexion AAROM;Right    Knee Flexion Limitations 102      Manual Therapy   Manual Therapy Edema management    Manual therapy comments edema mgmt performed to right knee to reduce edema performing effluerage to improve fluid mobility and demonstrates 16" circumference after procedure    Edema Management edema massage to right knee - tolerated moderately well                  PT Education - 01/25/21 0937    Education Details on skin/incision care, on keeping scabbed spot covered with bandaid and vaseline.    Person(s) Educated Patient    Methods Explanation    Comprehension Verbalized  understanding            PT Short Term Goals - 01/16/21 1024      PT SHORT TERM GOAL #1   Title Patient will be independent with HEP in order to improve functional outcomes.    Time 3    Period Weeks    Status New    Target Date 02/06/21      PT SHORT TERM GOAL #2   Title Patient will demo right knee extension to lacking 3 degrees and right knee flexion to 110 degrees to improve transfer and gait kinematics    Baseline lackng 10 degrees extension, 90 degrees flexoin    Time 3    Period Weeks    Status New    Target Date 02/06/21      PT SHORT TERM GOAL #3   Title Patient will be able to ambulate at least 300 feet in in order to demonstrate improved gait speed for community ambulation.    Baseline 180 with cane    Time 3    Period Weeks    Status New    Target Date 02/06/21             PT Long Term Goals - 01/16/21 1026      PT LONG TERM GOAL #1   Title Patient will improve on FOTO score to meet predicted outcomes to improve functional independence    Baseline 47% function    Time 7    Period Weeks    Status New    Target Date 03/06/21      PT LONG TERM GOAL #2   Title Patient will be able to ascend/descend 4 stairs with HR and reciprocal pattern    Baseline step to step    Time 7    Period Weeks    Status New    Target Date 03/06/21                 Plan - 01/25/21 0902    Clinical Impression Statement Continued to focus on knee ROM with focus on knee extension as this is patient's greatest limitation at this time. Educated patient on incision care as he still has one open/scabbed spot.  Also educated patient on use of compression garments which patient has a home and to start wearing them as it will help with swelling. Fatigue and slight increase in pain noted  end of session. Will continue with TKE focus as tolerance.    Personal Factors and Comorbidities Time since onset of injury/illness/exacerbation    Examination-Activity Limitations  Carry;Lift;Stand;Stairs;Squat;Sit;Locomotion Level;Transfers    Examination-Participation Restrictions Cleaning;Community Activity;Laundry;Yard Work;Occupation    Stability/Clinical Decision Making Stable/Uncomplicated    Rehab Potential Excellent    PT Frequency 2x / week    PT Duration --   7 weeks   PT Treatment/Interventions ADLs/Self Care Home Management;Aquatic Therapy;Cryotherapy;Electrical Stimulation;DME Instruction;Gait training;Stair training;Functional mobility training;Therapeutic activities;Therapeutic exercise;Balance training;Patient/family education;Neuromuscular re-education;Manual techniques;Passive range of motion;Taping;Energy conservation;Dry needling;Spinal Manipulations;Joint Manipulations    PT Next Visit Plan Continue with LE ROM/PRE exercises, manual therapy for right knee extension    PT Home Exercise Plan QS, knee ext mob, heel slides, side stepping, prone knee hang; 2/11 standing TKE           Patient will benefit from skilled therapeutic intervention in order to improve the following deficits and impairments:  Abnormal gait,Decreased activity tolerance,Decreased mobility,Decreased knowledge of use of DME,Decreased endurance,Decreased range of motion,Decreased strength,Difficulty walking,Improper body mechanics,Pain  Visit Diagnosis: Difficulty in walking, not elsewhere classified  Muscle weakness (generalized)     Problem List Patient Active Problem List   Diagnosis Date Noted  . Primary osteoarthritis of right knee    10:10 AM, 01/25/21 Tereasa Coop, DPT Physical Therapy with [facility]  [phone] office  [facility] [address] [place], [place], [address] Phone: [phone]   Fax:  [phone]  Name: Jyair Kiraly MRN: [id number] Date of Birth: [birth year]

## 2021-01-28 ENCOUNTER — Encounter (HOSPITAL_COMMUNITY): Payer: No Typology Code available for payment source

## 2021-01-29 DIAGNOSIS — Z96651 Presence of right artificial knee joint: Secondary | ICD-10-CM | POA: Insufficient documentation

## 2021-01-30 ENCOUNTER — Encounter (HOSPITAL_COMMUNITY): Payer: Self-pay

## 2021-01-30 ENCOUNTER — Other Ambulatory Visit: Payer: Self-pay

## 2021-01-30 ENCOUNTER — Ambulatory Visit (INDEPENDENT_AMBULATORY_CARE_PROVIDER_SITE_OTHER): Payer: No Typology Code available for payment source | Admitting: Orthopedic Surgery

## 2021-01-30 ENCOUNTER — Ambulatory Visit (HOSPITAL_COMMUNITY): Payer: No Typology Code available for payment source

## 2021-01-30 ENCOUNTER — Encounter: Payer: Self-pay | Admitting: Orthopedic Surgery

## 2021-01-30 VITALS — Ht 70.0 in | Wt 230.0 lb

## 2021-01-30 DIAGNOSIS — R262 Difficulty in walking, not elsewhere classified: Secondary | ICD-10-CM | POA: Diagnosis not present

## 2021-01-30 DIAGNOSIS — M6281 Muscle weakness (generalized): Secondary | ICD-10-CM

## 2021-01-30 DIAGNOSIS — Z96651 Presence of right artificial knee joint: Secondary | ICD-10-CM

## 2021-01-30 NOTE — Therapy (Signed)
[facility] [address] [place], [place], [address] Phone: [phone]   Fax:  [phone]  Physical Therapy Treatment  Patient Details  Name: Lance Petty MRN: [id number] Date of Birth: 1956/08/11 Referring Provider (PT): Ty Hilts, MD  AROM 11-112 degrees Ambulated 276 ft during with [facility]   Encounter Date: 01/30/2021   PT End of Session - 01/30/21 1054    Visit Number 5    Number of Visits 15    Date for PT Re-Evaluation 03/06/21    Authorization Type [facility] [employer], 15 approved visits    Authorization - Visit Number 5    Authorization - Number of Visits 15    Progress Note Due on Visit 10    PT Start Time 1001   4' on bike, not included with charges   PT Stop Time 1100   10' on ice at EOS, not included wiht charges   PT Time Calculation (min) 59 min    Activity Tolerance Patient tolerated treatment well    Behavior During Therapy [facility] for tasks assessed/performed           Past Medical History:  Diagnosis Date  . Arthritis   . Diabetes mellitus without complication (HCC)   . Hypercholesteremia   . Hypertension   . Hypertension     Past Surgical History:  Procedure Laterality Date  . KNEE SURGERY    . TONSILLECTOMY    . TOTAL KNEE ARTHROPLASTY Right 12/25/2020   Procedure: RIGHT TOTAL KNEE ARTHROPLASTY;  Surgeon: Vickki Hearing, MD;  Location: [facility];  Service: Orthopedics;  Laterality: Right;    There were no vitals filed for this visit.   Subjective Assessment - 01/30/21 1001    Subjective Pt stated knee is feeling better, pain scale 3/10.  Reports the swelling comes and goes.  Reports compliance wiht HEP wihtout questions.  Stated CPM to be picked up Friday.  Goes to MD later today.    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 Yes    Pain Score 3     Pain Location Knee    Pain Orientation Right    Pain Descriptors / Indicators  Tightness;Sore    Pain Type Surgical pain    Pain Onset 1 to 4 weeks ago    Pain Frequency Intermittent    Aggravating Factors  activity    Pain Relieving Factors rest, ice, CPM              [facility] PT Assessment - 01/30/21 0001      Assessment   Medical Diagnosis R TKR    Referring Provider (PT) Ty Hilts, MD    Onset Date/Surgical Date 12/25/20    Next MD Visit 01/30/21      Observation/Other Assessments   Focus on Therapeutic Outcomes (FOTO)  43% functional   was 47% functional initial eval     AROM   Right Knee Extension 12    Right Knee Flexion 112    Left Knee Extension 0    Left Knee Flexion 125                         [facility] Adult PT Treatment/Exercise - 01/30/21 0001      Ambulation/Gait   Ambulation/Gait Yes    Ambulation/Gait Assistance 6: Modified independent (Device/Increase time)    Ambulation Distance (Feet) 276 Feet  Assistive device Straight cane    Gait Pattern Step-through pattern    Ambulation Surface Level;Indoor    Gait Comments      Knee/Hip Exercises: Stretches   Knee: Self-Stretch to increase Flexion 5 reps;10 seconds    Knee: Self-Stretch Limitations knee drive on 70WC step height for flexion      Knee/Hip Exercises: Standing   Terminal Knee Extension AROM;Right;2 sets;10 reps;Theraband    Theraband Level (Terminal Knee Extension) Level 2 (Red)    Terminal Knee Extension Limitations 5" holds initially pushing into therapist's hands then with RTB    Rocker Board 2 minutes    Rocker Board Limitations lateral      Knee/Hip Exercises: Seated   Long Arc Quad 10 reps    Long Arc Quad Limitations DF and 5" holds      Knee/Hip Exercises: Supine   Quad Sets Strengthening;Right;2 sets;10 reps    Short Arc Quad Sets 2 sets;10 reps    Heel Slides AROM;Right;1 set;15 reps    Knee Extension AROM;Right    Knee Flexion AROM;Right    Other Supine Knee/Hip Exercises Knee prop x for extension      Manual Therapy    Manual Therapy Edema management    Manual therapy comments Manual complete separate than rest of tx    Edema Management Retrograde massage with LE elevated                    PT Short Term Goals - 01/30/21 1015      PT SHORT TERM GOAL #1   Title Patient will be independent with HEP in order to improve functional outcomes.    Baseline 01/30/21:  Reports compliance wiht HEP    Status On-going      PT SHORT TERM GOAL #2   Title Patient will demo right knee extension to lacking 3 degrees and right knee flexion to 110 degrees to improve transfer and gait kinematics    Baseline 01/30/21: 11-112 degrees; was 12-100 degrees initial eval    Status On-going      PT SHORT TERM GOAL #3   Title Patient will be able to ambulate at least 300 feet in in order to demonstrate improved gait speed for community ambulation.    Baseline 01/30/21:  : 239ft with cane ;was 180 with cane    Status On-going             PT Long Term Goals - 01/16/21 1026      PT LONG TERM GOAL #1   Title Patient will improve on FOTO score to meet predicted outcomes to improve functional independence    Baseline 47% function    Time 7    Period Weeks    Status New    Target Date 03/06/21      PT LONG TERM GOAL #2   Title Patient will be able to ascend/descend 4 stairs with HR and reciprocal pattern    Baseline step to step    Time 7    Period Weeks    Status New    Target Date 03/06/21                 Plan - 01/30/21 1154    Clinical Impression Statement Session focus on improving AROM primarly extension as this is pt.'s greatest limitation at this stime.  Reviewed STGs prior MD apt later today.  Pt AROM 11-112 degrees was 12-100 degrees initial eval.  Improved distance noted with .  Pt presents with very weak quadriceps.  Educated importance of compliance wiht HEP and encouraged to complete 100 quad sets daily.  Added SAQ and heel prop to HEP for quad strenghtening to improve  extension.  Manual complete to address edema present proximal knee prior ROM based exercises today.    Personal Factors and Comorbidities Time since onset of injury/illness/exacerbation    Examination-Activity Limitations Carry;Lift;Stand;Stairs;Squat;Sit;Locomotion Level;Transfers    Examination-Participation Restrictions Cleaning;Community Activity;Laundry;Yard Work;Occupation    Stability/Clinical Decision Making Stable/Uncomplicated    Clinical Decision Making Low    Rehab Potential Excellent    PT Frequency 2x / week    PT Duration --   7 weeks   PT Treatment/Interventions ADLs/Self Care Home Management;Aquatic Therapy;Cryotherapy;Electrical Stimulation;DME Instruction;Gait training;Stair training;Functional mobility training;Therapeutic activities;Therapeutic exercise;Balance training;Patient/family education;Neuromuscular re-education;Manual techniques;Passive range of motion;Taping;Energy conservation;Dry needling;Spinal Manipulations;Joint Manipulations    PT Next Visit Plan Continue with LE ROM/PRE exercises, manual therapy for right knee extension    PT Home Exercise Plan QS, knee ext mob, heel slides, side stepping, prone knee hang; 2/11 standing TKE           Patient will benefit from skilled therapeutic intervention in order to improve the following deficits and impairments:  Abnormal gait,Decreased activity tolerance,Decreased mobility,Decreased knowledge of use of DME,Decreased endurance,Decreased range of motion,Decreased strength,Difficulty walking,Improper body mechanics,Pain  Visit Diagnosis: Difficulty in walking, not elsewhere classified  Muscle weakness (generalized)     Problem List Patient Active Problem List   Diagnosis Date Noted  . 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 Primary osteoarthritis of right knee    Becky Sax, LPTA/CLT; CBIS [phone]  Juel Burrow 01/30/2021, 12:03 PM  [facility] [address] [place], [place], [address] Phone: [phone]   Fax:  [phone]  Name: Lance Petty MRN: [id number] Date of Birth: 1956-09-10

## 2021-01-30 NOTE — Patient Instructions (Addendum)
Quad Sets    Squeeze pelvic floor and hold. Tighten top of left thigh.  Hold for 10 seconds.  Repeat 10-20 times. Do 100 reps per day.   Copyright  VHI. All rights reserved.   Short Arc Knee Extension    Place bolster under left knee so it is bent slightly. Squeeze pelvic floor and hold. Straighten knee.  Hold for 10 seconds. Repeat 10 times. Do 2 times a day. Repeat with other leg.  Copyright  VHI. All rights reserved.   ROM: Heel Prop    Prop up Rt heel on pillow or coffee table while sitting.  Tighten the muscles on the top of leg while trying to push knee toward the floor. Hold 10 seconds. Repeat 10 times. Do 2 sessions per day.  http://gt2.exer.us/287   Copyright  VHI. All rights reserved.

## 2021-01-30 NOTE — Progress Notes (Signed)
Chief Complaint  Patient presents with  . Routine Post Op    Rt TKR DOS 12/25/20    POST OP RT TKA   Encounter Diagnosis  Name Primary?  . Status post total right knee replacement 12/25/20 Yes   Week 5 post op   Lance Petty is progressing well with his right total knee he does have a little wound at the proximal portion of the incision.  I asked him to clean it with soap and water apply Neosporin and a Band-Aid  His flexion is past 90 degrees he has near full extension is using a cane  Follow-up on 23 March

## 2021-02-01 ENCOUNTER — Ambulatory Visit (HOSPITAL_COMMUNITY): Payer: No Typology Code available for payment source

## 2021-02-04 ENCOUNTER — Encounter (HOSPITAL_COMMUNITY): Payer: No Typology Code available for payment source | Admitting: Physical Therapy

## 2021-02-06 ENCOUNTER — Other Ambulatory Visit: Payer: Self-pay

## 2021-02-06 ENCOUNTER — Ambulatory Visit (HOSPITAL_COMMUNITY): Payer: No Typology Code available for payment source

## 2021-02-06 DIAGNOSIS — M6281 Muscle weakness (generalized): Secondary | ICD-10-CM

## 2021-02-06 DIAGNOSIS — R262 Difficulty in walking, not elsewhere classified: Secondary | ICD-10-CM

## 2021-02-06 NOTE — Therapy (Signed)
[facility] [address] [place], [place], [address] Phone: [phone]   Fax:  [phone]  Physical Therapy Treatment  Patient Details  Name: Lance Petty MRN: [id number] Date of Birth: 01-09-1956 Referring Provider (PT): Ty Hilts, MD   Encounter Date: 02/06/2021   PT End of Session - 02/06/21 1021    Visit Number 6    Number of Visits 15    Date for PT Re-Evaluation 03/06/21    Authorization Type [facility] [employer], 15 approved visits    Authorization - Visit Number 6    Authorization - Number of Visits 15    Progress Note Due on Visit 10    PT Start Time 1017    PT Stop Time 1111    PT Time Calculation (min) 54 min    Activity Tolerance Patient tolerated treatment well    Behavior During Therapy [facility] for tasks assessed/performed           Past Medical History:  Diagnosis Date  . Arthritis   . Diabetes mellitus without complication (HCC)   . Hypercholesteremia   . Hypertension   . Hypertension     Past Surgical History:  Procedure Laterality Date  . KNEE SURGERY    . TONSILLECTOMY    . TOTAL KNEE ARTHROPLASTY Right 12/25/2020   Procedure: RIGHT TOTAL KNEE ARTHROPLASTY;  Surgeon: Vickki Hearing, MD;  Location: [facility];  Service: Orthopedics;  Laterality: Right;    There were no vitals filed for this visit.   Subjective Assessment - 02/06/21 1023    Subjective Patient reports feeling better and had good check up with ortho MD.  He has been walking his neighborhood on a daily basis.  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 No/denies    Pain Score 0-No pain    Pain Onset 1 to 4 weeks ago              [facility] PT Assessment - 02/06/21 0001      Assessment   Medical Diagnosis R TKR    Referring Provider (PT) Ty Hilts, MD    Next MD Visit 03/06/21                          [facility] Adult PT Treatment/Exercise - 02/06/21 0001      Ambulation/Gait   Ambulation/Gait Yes    Ambulation/Gait Assistance 6: Modified independent (Device/Increase time)    Ambulation Distance (Feet) 300 Feet    Assistive device Straight cane    Gait Pattern Step-through pattern    Ambulation Surface Level    Gait velocity - backwards 4x30 ft      Knee/Hip Exercises: Stretches   Lobbyist Right;4 reps;60 seconds   prone     Knee/Hip Exercises: Aerobic   Recumbent Bike 6 min: 2 min backwards, 4 min forward      Knee/Hip Exercises: Standing   Knee Flexion Strengthening;Right;2 sets;10 reps    Knee Flexion Limitations 5 lbs    Hip Flexion Limitations 2 min, 5 lbs stair taps 6" step, 2 min 10" step    Terminal Knee Extension Strengthening;Right;2 sets;10 reps;Theraband    Theraband Level (Terminal Knee Extension) Level 4 (Blue)    SLS left foot elevated 6" step x 30 sec, left foot elevated 10" step x 30 sec  Other Standing Knee Exercises tandem stance 2x15 eyes open/closed      Knee/Hip Exercises: Seated   Long Arc Quad Strengthening;Right;3 sets;10 reps    Long Arc Quad Weight 5 lbs.    Stool Scoot - Round Trips 2 min      Knee/Hip Exercises: Supine   Heel Slides AROM;Right;20 reps    Heel Slides Limitations 115 degrees flexion      Knee/Hip Exercises: Prone   Other Prone Exercises prone right TKE 2x10 3 sec hold      Manual Therapy   Manual Therapy Joint mobilization    Manual therapy comments Manual complete separate than rest of tx    Joint Mobilization grade 3 posterior glides right tib-femoral to facilitate terminal knee extension. Grade 2 oscillations internal/external rotation for pain control between stretch                  PT Education - 02/06/21 1100    Education Details education on HEP additions            PT Short Term Goals - 01/30/21 1015      PT SHORT TERM GOAL #1   Title Patient will be independent with  HEP in order to improve functional outcomes.    Baseline 01/30/21:  Reports compliance wiht HEP    Status On-going      PT SHORT TERM GOAL #2   Title Patient will demo right knee extension to lacking 3 degrees and right knee flexion to 110 degrees to improve transfer and gait kinematics    Baseline 01/30/21: 11-112 degrees; was 12-100 degrees initial eval    Status On-going      PT SHORT TERM GOAL #3   Title Patient will be able to ambulate at least 300 feet in in order to demonstrate improved gait speed for community ambulation.    Baseline 01/30/21:  : 274ft with cane ;was 180 with cane    Status On-going             PT Long Term Goals - 01/16/21 1026      PT LONG TERM GOAL #1   Title Patient will improve on FOTO score to meet predicted outcomes to improve functional independence    Baseline 47% function    Time 7    Period Weeks    Status New    Target Date 03/06/21      PT LONG TERM GOAL #2   Title Patient will be able to ascend/descend 4 stairs with HR and reciprocal pattern    Baseline step to step    Time 7    Period Weeks    Status New    Target Date 03/06/21                 Plan - 02/06/21 1115    Clinical Impression Statement Demonstrtaing improved right knee ROM and activity tolerance with self-report of walking 1/8 mile/daily at home and reports increased strength with decreased gait dysfunction.  Continues to lack 5-8 degrees terminal knee extension and peri-patellar edema still presnet. Continued tx indicate dto improve gait    Personal Factors and Comorbidities Time since onset of injury/illness/exacerbation    Examination-Activity Limitations Carry;Lift;Stand;Stairs;Squat;Sit;Locomotion Level;Transfers    Examination-Participation Restrictions Cleaning;Community Activity;Laundry;Yard Work;Occupation    Stability/Clinical Decision Making Stable/Uncomplicated    Rehab Potential Excellent    PT Frequency 2x / week    PT Duration --   7 weeks    PT Treatment/Interventions ADLs/Self Care Home Management;Aquatic  Therapy;Cryotherapy;Electrical Stimulation;DME Instruction;Gait training;Stair training;Functional mobility training;Therapeutic activities;Therapeutic exercise;Balance training;Patient/family education;Neuromuscular re-education;Manual techniques;Passive range of motion;Taping;Energy conservation;Dry needling;Spinal Manipulations;Joint Manipulations    PT Next Visit Plan Continue with LE ROM/PRE exercises, manual therapy for right knee extension    PT Home Exercise Plan QS, knee ext mob, heel slides, side stepping, prone knee hang; 2/11 standing TKE           Patient will benefit from skilled therapeutic intervention in order to improve the following deficits and impairments:  Abnormal gait,Decreased activity tolerance,Decreased mobility,Decreased knowledge of use of DME,Decreased endurance,Decreased range of motion,Decreased strength,Difficulty walking,Improper body mechanics,Pain  Visit Diagnosis: Difficulty in walking, not elsewhere classified  Muscle weakness (generalized)     Problem List Patient Active Problem List   Diagnosis Date Noted  . 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 Primary osteoarthritis of right knee    11:18 AM, 02/06/21 M. Shary Decamp, PT, DPT Physical Therapist- [place] Office Number: [phone]  [facility] [address] [place], [place], [address] Phone: [phone]   Fax:  [phone]  Name: Lance Petty MRN: [id number] Date of Birth: 1956-07-30

## 2021-02-08 ENCOUNTER — Ambulatory Visit (HOSPITAL_COMMUNITY): Payer: No Typology Code available for payment source

## 2021-02-08 ENCOUNTER — Other Ambulatory Visit: Payer: Self-pay

## 2021-02-08 ENCOUNTER — Encounter (HOSPITAL_COMMUNITY): Payer: Self-pay

## 2021-02-08 DIAGNOSIS — M6281 Muscle weakness (generalized): Secondary | ICD-10-CM

## 2021-02-08 DIAGNOSIS — R262 Difficulty in walking, not elsewhere classified: Secondary | ICD-10-CM

## 2021-02-08 NOTE — Therapy (Signed)
[facility] [address] [place], [place], [address] Phone: [phone]   Fax:  [phone]  Physical Therapy Treatment  Patient Details  Name: Lance Petty MRN: [id number] Date of Birth: 1956-10-24 Referring Provider (PT): Ty Hilts, MD   Encounter Date: 02/08/2021   PT End of Session - 02/08/21 1059    Visit Number 7    Number of Visits 15    Date for PT Re-Evaluation 03/06/21    Authorization Type [employer], 15 approved visits    Authorization - Visit Number 7    Authorization - Number of Visits 15    Progress Note Due on Visit 10    PT Start Time 1100    PT Stop Time 1200    PT Time Calculation (min) 60 min    Activity Tolerance Patient tolerated treatment well    Behavior During Therapy [facility] for tasks assessed/performed           Past Medical History:  Diagnosis Date  . Arthritis   . Diabetes mellitus without complication (HCC)   . Hypercholesteremia   . Hypertension   . Hypertension     Past Surgical History:  Procedure Laterality Date  . KNEE SURGERY    . TONSILLECTOMY    . TOTAL KNEE ARTHROPLASTY Right 12/25/2020   Procedure: RIGHT TOTAL KNEE ARTHROPLASTY;  Surgeon: Vickki Hearing, MD;  Location: [facility];  Service: Orthopedics;  Laterality: Right;    There were no vitals filed for this visit.   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 No/denies    Pain Score 0-No pain    Pain Location Knee    Pain Orientation Right    Pain Descriptors / Indicators Tightness    Pain Type Surgical pain    Pain Onset 1 to 4 weeks ago              [facility] PT Assessment - 02/08/21 0001      Assessment   Medical Diagnosis R TKR    Referring Provider (PT) Ty Hilts, MD     Next MD Visit 03/06/21      Observation/Other Assessments   Observations +Noble compression right knee, increased discomfort with Obers and modified Ober's position                         [facility] Adult PT Treatment/Exercise - 02/08/21 0001      Knee/Hip Exercises: Stretches   Active Hamstring Stretch Right   5x10 reps with soft tiusse release by therapist along distal ITB   ITB Stretch Right;5 reps;60 seconds      Knee/Hip Exercises: Aerobic   Recumbent Bike 6 min forward pedaling      Knee/Hip Exercises: Standing   Other Standing Knee Exercises knee drive for flexino on 10" step 2x10 with 3 sec hold      Manual Therapy   Manual Therapy Soft tissue mobilization    Manual therapy comments Manual complete separate than rest of tx    Soft tissue mobilization soft tissue mobilization right distal ITB to reduce tension and pain and alleviate pain with active extension using tool and manually.  Soft tissue release via active release with pt performing LAQ and  therapist stripping distal ITB                  PT Education - 02/08/21 1210    Education Details education on activity modification and emphasis on tasks to minimize ITB pain and minimize right lateral weight displacement in stance phase to ameliorate lateral knee pain/tightness    Person(s) Educated Patient    Methods Explanation    Comprehension Verbalized understanding            PT Short Term Goals - 01/30/21 1015      PT SHORT TERM GOAL #1   Title Patient will be independent with HEP in order to improve functional outcomes.    Baseline 01/30/21:  Reports compliance wiht HEP    Status On-going      PT SHORT TERM GOAL #2   Title Patient will demo right knee extension to lacking 3 degrees and right knee flexion to 110 degrees to improve transfer and gait kinematics    Baseline 01/30/21: 11-112 degrees; was 12-100 degrees initial eval    Status On-going      PT SHORT TERM GOAL #3   Title Patient  will be able to ambulate at least 300 feet in in order to demonstrate improved gait speed for community ambulation.    Baseline 01/30/21:  : 219ft with cane ;was 180 with cane    Status On-going             PT Long Term Goals - 01/16/21 1026      PT LONG TERM GOAL #1   Title Patient will improve on FOTO score to meet predicted outcomes to improve functional independence    Baseline 47% function    Time 7    Period Weeks    Status New    Target Date 03/06/21      PT LONG TERM GOAL #2   Title Patient will be able to ascend/descend 4 stairs with HR and reciprocal pattern    Baseline step to step    Time 7    Period Weeks    Status New    Target Date 03/06/21                 Plan - 02/08/21 1211    Clinical Impression Statement Exhibiting increased right lateral knee pain with discomfort along distal ITB.  Pt instructed in mgmt techniques to ameliorate and improve stance phase during gait to minimize compensation.  Continues to progress with POC details and reports overall improved ambulation status since start of care.  Continued tx indicated to improve strength and gait kinematics    Personal Factors and Comorbidities Time since onset of injury/illness/exacerbation    Examination-Activity Limitations Carry;Lift;Stand;Stairs;Squat;Sit;Locomotion Level;Transfers    Examination-Participation Restrictions Cleaning;Community Activity;Laundry;Yard Work;Occupation    Stability/Clinical Decision Making Stable/Uncomplicated    Rehab Potential Excellent    PT Frequency 2x / week    PT Duration --   7 weeks   PT Treatment/Interventions ADLs/Self Care Home Management;Aquatic Therapy;Cryotherapy;Electrical Stimulation;DME Instruction;Gait training;Stair training;Functional mobility training;Therapeutic activities;Therapeutic exercise;Balance training;Patient/family education;Neuromuscular re-education;Manual techniques;Passive range of motion;Taping;Energy conservation;Dry  needling;Spinal Manipulations;Joint Manipulations    PT Next Visit Plan Continue with LE ROM/PRE exercises, manual therapy for right knee extension. Squats with mirror for symmetry    PT Home Exercise Plan QS, knee ext mob, heel slides, side stepping, prone knee hang; 2/11 standing TKE           Patient will benefit from skilled therapeutic intervention in order to improve  the following deficits and impairments:  Abnormal gait,Decreased activity tolerance,Decreased mobility,Decreased knowledge of use of DME,Decreased endurance,Decreased range of motion,Decreased strength,Difficulty walking,Improper body mechanics,Pain  Visit Diagnosis: Difficulty in walking, not elsewhere classified  Muscle weakness (generalized)     Problem List Patient Active Problem List   Diagnosis Date Noted  . 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 Primary osteoarthritis of right knee     12:16 PM, 02/08/21 M. Shary Decamp, PT, DPT Physical Therapist- [place] Office Number: [phone]  [facility] [address] [place], [place], [address] Phone: [phone]   Fax:  [phone]  Name: Lance Petty MRN: [id number] Date of Birth: 06-25-1956

## 2021-02-11 ENCOUNTER — Encounter (HOSPITAL_COMMUNITY): Payer: No Typology Code available for payment source | Admitting: Physical Therapy

## 2021-02-13 ENCOUNTER — Other Ambulatory Visit: Payer: Self-pay

## 2021-02-13 ENCOUNTER — Ambulatory Visit (HOSPITAL_COMMUNITY): Payer: No Typology Code available for payment source | Attending: Orthopedic Surgery

## 2021-02-13 ENCOUNTER — Encounter (HOSPITAL_COMMUNITY): Payer: Self-pay

## 2021-02-13 DIAGNOSIS — R262 Difficulty in walking, not elsewhere classified: Secondary | ICD-10-CM | POA: Diagnosis present

## 2021-02-13 DIAGNOSIS — M6281 Muscle weakness (generalized): Secondary | ICD-10-CM | POA: Diagnosis present

## 2021-02-13 NOTE — Therapy (Signed)
[facility] [address] [place], [place], [address] Phone: [phone]   Fax:  [phone]  Physical Therapy Treatment  Patient Details  Name: Lance Petty MRN: [id number] Date of Birth: May 24, 1956 Referring Provider (PT): Ty Hilts, MD   Encounter Date: 02/13/2021   PT End of Session - 02/13/21 1153    Visit Number 8    Number of Visits 15    Date for PT Re-Evaluation 03/06/21    Authorization Type [facility] [employer], 15 approved visits    Authorization - Visit Number 8    Authorization - Number of Visits 15    Progress Note Due on Visit 10    PT Start Time 1135    PT Stop Time 1214    PT Time Calculation (min) 39 min    Activity Tolerance Patient tolerated treatment well;Patient limited by pain    Behavior During Therapy [facility] for tasks assessed/performed           Past Medical History:  Diagnosis Date  . Arthritis   . Diabetes mellitus without complication (HCC)   . Hypercholesteremia   . Hypertension   . Hypertension     Past Surgical History:  Procedure Laterality Date  . KNEE SURGERY    . TONSILLECTOMY    . TOTAL KNEE ARTHROPLASTY Right 12/25/2020   Procedure: RIGHT TOTAL KNEE ARTHROPLASTY;  Surgeon: Vickki Hearing, MD;  Location: [facility];  Service: Orthopedics;  Laterality: Right;    There were no vitals filed for this visit.   Subjective Assessment - 02/13/21 1141    Subjective Pt stated main problem with tightness and swelling around the knee, reports he has been walking more.    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 Yes    Pain Score 2     Pain Location Knee    Pain Orientation Right    Pain Descriptors / Indicators Tightness    Pain Type Surgical pain    Pain Onset 1 to 4 weeks ago    Pain Frequency Intermittent    Aggravating Factors  activity    Pain Relieving Factors rest, ice, CPM              [facility] PT Assessment -  02/13/21 0001      Assessment   Medical Diagnosis R TKR    Referring Provider (PT) Ty Hilts, MD    Onset Date/Surgical Date 12/25/20    Next MD Visit 03/06/21    Prior Therapy Home Health therapy                         [facility] Adult PT Treatment/Exercise - 02/13/21 0001      Knee/Hip Exercises: Stretches   Active Hamstring Stretch Right;30 seconds      Knee/Hip Exercises: Seated   Other Seated Knee/Hip Exercises self care STM with rolling stick to quad and ITB      Knee/Hip Exercises: Supine   Short Arc Quad Sets 15 reps    Knee Extension AROM;Right    Knee Extension Limitations 10    Knee Flexion AROM;Right    Knee Flexion Limitations 110      Knee/Hip Exercises: Prone   Prone Knee Hang 1 minute   2 sets   Other Prone Exercises prone right TKE 10x 5" holds      Manual Therapy   Manual Therapy Edema management  Manual therapy comments Manual complete separate than rest of tx    Edema Management Retrograde massage with LE elevated    Soft tissue mobilization instructed self care STM with rolling stick to ITB and quad                  PT Education - 02/13/21 1201    Education Details Educated on benefits with compression hose to address edema; educated on rolling stick self massage to ITB and quad    Person(s) Educated Patient    Methods Explanation    Comprehension Verbalized understanding            PT Short Term Goals - 01/30/21 1015      PT SHORT TERM GOAL #1   Title Patient will be independent with HEP in order to improve functional outcomes.    Baseline 01/30/21:  Reports compliance wiht HEP    Status On-going      PT SHORT TERM GOAL #2   Title Patient will demo right knee extension to lacking 3 degrees and right knee flexion to 110 degrees to improve transfer and gait kinematics    Baseline 01/30/21: 11-112 degrees; was 12-100 degrees initial eval    Status On-going      PT SHORT TERM GOAL #3   Title Patient will be  able to ambulate at least 300 feet in in order to demonstrate improved gait speed for community ambulation.    Baseline 01/30/21:  : 231ft with cane ;was 180 with cane    Status On-going             PT Long Term Goals - 01/16/21 1026      PT LONG TERM GOAL #1   Title Patient will improve on FOTO score to meet predicted outcomes to improve functional independence    Baseline 47% function    Time 7    Period Weeks    Status New    Target Date 03/06/21      PT LONG TERM GOAL #2   Title Patient will be able to ascend/descend 4 stairs with HR and reciprocal pattern    Baseline step to step    Time 7    Period Weeks    Status New    Target Date 03/06/21                 Plan - 02/13/21 1223    Clinical Impression Statement Pt arrived with edema present proximal knee, began session with retrograde massage to address edema prior ROM based exercises.  Educated benefits with compression garment for edema control, pt stated he has thigh highs at home.  Educated to change every 6 months.  Primarly limitation noted wiht knee mobility especially extension.  Therex for on quad strengthening and hamstring lengthening.  Pt reports increased pain following prone knee hang.  Added SAQ and hamstring stretch to HEP.    Personal Factors and Comorbidities Time since onset of injury/illness/exacerbation    Examination-Activity Limitations Carry;Lift;Stand;Stairs;Squat;Sit;Locomotion Level;Transfers    Examination-Participation Restrictions Cleaning;Community Activity;Laundry;Yard Work;Occupation    Stability/Clinical Decision Making Stable/Uncomplicated    Clinical Decision Making Low    Rehab Potential Excellent    PT Frequency 2x / week    PT Duration --   7 weeks   PT Treatment/Interventions ADLs/Self Care Home Management;Aquatic Therapy;Cryotherapy;Electrical Stimulation;DME Instruction;Gait training;Stair training;Functional mobility training;Therapeutic activities;Therapeutic  exercise;Balance training;Patient/family education;Neuromuscular re-education;Manual techniques;Passive range of motion;Taping;Energy conservation;Dry needling;Spinal Manipulations;Joint Manipulations    PT Next Visit Plan Pt forgot  HEP printout, give him next session.  Continue with LE ROM/PRE exercises, manual therapy for right knee extension. Squats with mirror for symmetry    PT Home Exercise Plan QS, knee ext mob, heel slides, side stepping, prone knee hang; 2/11 standing TKE; 3/2: SAQ and hamstring stretch.           Patient will benefit from skilled therapeutic intervention in order to improve the following deficits and impairments:  Abnormal gait,Decreased activity tolerance,Decreased mobility,Decreased knowledge of use of DME,Decreased endurance,Decreased range of motion,Decreased strength,Difficulty walking,Improper body mechanics,Pain  Visit Diagnosis: Difficulty in walking, not elsewhere classified  Muscle weakness (generalized)     Problem List Patient Active Problem List   Diagnosis Date Noted  . 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 Primary osteoarthritis of right knee    Becky Sax, LPTA/CLT; CBIS [phone]  Juel Burrow 02/13/2021, 1:20 PM  [place] [facility] [address] [place], [place], [address] Phone: [phone]   Fax:  [phone]  Name: Chez Bulnes MRN: [id number] Date of Birth: 26-Jun-1956

## 2021-02-13 NOTE — Patient Instructions (Addendum)
Short Arc Knee Extension    Place bolster under left knee so it is bent slightly. Squeeze pelvic floor and hold. Straighten knee. Hold for 5-10 seconds.  Repeat 10 times. Do 3 times a day. Repeat with other leg.   Copyright  VHI. All rights reserved.   Supine    Lie on back, legs bent and feet flat. Grasp behind one leg and slowly try to straighten knee. Hold 30 seconds.  Repeat 3 times per session. Do 2 sessions per day.  Copyright  VHI. All rights reserved.

## 2021-02-14 ENCOUNTER — Ambulatory Visit (HOSPITAL_COMMUNITY): Payer: No Typology Code available for payment source | Admitting: Physical Therapy

## 2021-02-14 ENCOUNTER — Encounter (HOSPITAL_COMMUNITY): Payer: Self-pay | Admitting: Physical Therapy

## 2021-02-14 DIAGNOSIS — M6281 Muscle weakness (generalized): Secondary | ICD-10-CM

## 2021-02-14 DIAGNOSIS — R262 Difficulty in walking, not elsewhere classified: Secondary | ICD-10-CM

## 2021-02-14 NOTE — Therapy (Signed)
[facility] [address] [place], [place], [address] Phone: [phone]   Fax:  [phone]  Physical Therapy Treatment and Progress Note  Patient Details  Name: Lance Petty MRN: [id number] Date of Birth: 1956-01-05 Referring Provider (PT): Adonis Huguenin, MD  Progress Note Reporting Period 01/16/21 to 02/14/21  See note below for Objective Data and Assessment of Progress/Goals.       Encounter Date: 02/14/2021   PT End of Session - 02/14/21 1132    Visit Number 9    Number of Visits 15    Date for PT Re-Evaluation 03/06/21    Authorization Type [place], 15 approved visits    Authorization - Visit Number 9    Authorization - Number of Visits 15    Progress Note Due on Visit 19    PT Start Time 1130    PT Stop Time 1210    PT Time Calculation (min) 40 min    Activity Tolerance Patient tolerated treatment well;Patient limited by pain    Behavior During Therapy WFL for tasks assessed/performed           Past Medical History:  Diagnosis Date  . Arthritis   . Diabetes mellitus without complication ([place])   . Hypercholesteremia   . Hypertension   . Hypertension     Past Surgical History:  Procedure Laterality Date  . KNEE SURGERY    . TONSILLECTOMY    . TOTAL KNEE ARTHROPLASTY Right 12/25/2020   Procedure: RIGHT TOTAL KNEE ARTHROPLASTY;  Surgeon: Carole Civil, MD;  Location: [facility];  Service: Orthopedics;  Laterality: Right;    There were no vitals filed for this visit.   Subjective Assessment - 02/14/21 1134    Subjective States overall he feels 80% better since start of PT.    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 Yes    Pain Score 2     Pain Location Knee    Pain Orientation Right    Pain Descriptors / Indicators Throbbing    Pain Radiating Towards knee cap    Pain Onset 1 to 4 weeks ago              [facility] PT Assessment -  02/14/21 0001      Assessment   Medical Diagnosis R TKR    Referring Provider (PT) Adonis Huguenin, MD    Onset Date/Surgical Date 12/25/20    Next MD Visit 03/06/21    Prior [facility] therapy      Observation/Other Assessments   Focus on Therapeutic Outcomes (FOTO)  57% function   was 43% functional     Ambulation/Gait   Ambulation/Gait Yes    Ambulation/Gait Assistance 6: Modified independent (Device/Increase time)    Ambulation Distance (Feet) 284 Feet    Assistive device Straight cane    Gait Pattern Step-through pattern    Ambulation Surface Level    Stairs Yes    Stairs Assistance 6: Modified independent (Device/Increase time)    Stair Management Technique One rail Left;Alternating pattern    Number of Stairs 7    Height of Stairs 4    Gait Comments 2MW, 2 x5 sets of stairs                         [facility] Adult PT Treatment/Exercise - 02/14/21 0001      Knee/Hip Exercises: Stretches  Active Hamstring Stretch Right;30 seconds;3 reps    Other Knee/Hip Stretches knee flexion on step x10 10" holds R      Knee/Hip Exercises: Standing   Terminal Knee Extension Strengthening;Right;2 sets;10 reps   wall with ball     Knee/Hip Exercises: Seated   Sit to Sand 5 reps;without UE support;3 sets      Knee/Hip Exercises: Supine   Heel Slides AROM;Right;15 reps   holds at end ranges for 10 seconds   Knee Extension AROM;Right    Knee Extension Limitations 10   lacking   Knee Flexion AROM;Right    Knee Flexion Limitations 110      Manual Therapy   Manual Therapy Soft tissue mobilization;Joint mobilization    Manual therapy comments Manual complete separate than rest of tx    Joint Mobilization grade III R AP/PA femur/tib mobilizations tolerated well    Soft tissue mobilization STM to right hamstrings and quads - tolerated well                  PT Education - 02/14/21 1206    Education Details on progress made, on focus moving forward with PT,  on FOTO score    Person(s) Educated Patient    Methods Explanation    Comprehension Verbalized understanding            PT Short Term Goals - 02/14/21 1136      PT SHORT TERM GOAL #1   Title Patient will be independent with HEP in order to improve functional outcomes.    Baseline 3/3  Reports compliance wiht HEP    Status On-going      PT SHORT TERM GOAL #2   Title Patient will demo right knee extension to lacking 3 degrees and right knee flexion to 110 degrees to improve transfer and gait kinematics    Baseline 01/30/21: 11-112 degrees; was 12-100 degrees initial eval    Status On-going      PT SHORT TERM GOAL #3   Title Patient will be able to ambulate at least 300 feet in 2MWT in order to demonstrate improved gait speed for community ambulation.    Baseline 284    Status On-going             PT Long Term Goals - 02/14/21 1140      PT LONG TERM GOAL #1   Title Patient will improve on FOTO score to meet predicted outcomes to improve functional independence    Baseline 57%    Time 7    Period Weeks    Status On-going      PT LONG TERM GOAL #2   Title Patient will be able to ascend/descend 4 stairs with HR and reciprocal pattern    Baseline step to step    Time 7    Period Weeks    Status On-going                 Plan - 02/14/21 1209    Clinical Impression Statement Patient present for a progress note on this date. No goals met at this time but patient is progressing towards goals. Terminal knee extension continues to be greatest limitation at this time. Focused on education and terminal knee extension on this date. Patient reported fatigue and soreness end of session. Patient would greatly benefit from skilled physical therapy at this time.    Personal Factors and Comorbidities Time since onset of injury/illness/exacerbation    Examination-Activity Limitations Carry;Lift;Stand;Stairs;Squat;Sit;Locomotion Level;Transfers  Examination-Participation  Restrictions Cleaning;Community Activity;Laundry;Yard Work;Occupation    Stability/Clinical Decision Making Stable/Uncomplicated    Rehab Potential Excellent    PT Frequency 2x / week    PT Duration --   7 weeks   PT Treatment/Interventions ADLs/Self Care Home Management;Aquatic Therapy;Cryotherapy;Electrical Stimulation;DME Instruction;Gait training;Stair training;Functional mobility training;Therapeutic activities;Therapeutic exercise;Balance training;Patient/family education;Neuromuscular re-education;Manual techniques;Passive range of motion;Taping;Energy conservation;Dry needling;Spinal Manipulations;Joint Manipulations    PT Next Visit Plan Continue with LE ROM/PRE exercises, manual therapy for right knee extension. Squats with mirror for symmetry    PT Home Exercise Plan QS, knee ext mob, heel slides, side stepping, prone knee hang; 2/11 standing TKE; 3/2: SAQ and hamstring stretch.           Patient will benefit from skilled therapeutic intervention in order to improve the following deficits and impairments:  Abnormal gait,Decreased activity tolerance,Decreased mobility,Decreased knowledge of use of DME,Decreased endurance,Decreased range of motion,Decreased strength,Difficulty walking,Improper body mechanics,Pain  Visit Diagnosis: Difficulty in walking, not elsewhere classified  Muscle weakness (generalized)     Problem List Patient Active Problem List   Diagnosis Date Noted  . 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 Primary osteoarthritis of right knee    1:16 PM, 02/14/21 Jerene Pitch, DPT Physical Therapy with [facility]  [phone] office  [place], [place], [address] Phone: [phone]   Fax:  [phone]  Name: Lance Petty MRN: [id number] Date of Birth: 04/27/1956

## 2021-02-15 ENCOUNTER — Ambulatory Visit (HOSPITAL_COMMUNITY): Payer: No Typology Code available for payment source

## 2021-02-18 ENCOUNTER — Encounter (HOSPITAL_COMMUNITY): Payer: No Typology Code available for payment source | Admitting: Physical Therapy

## 2021-02-20 ENCOUNTER — Other Ambulatory Visit: Payer: Self-pay

## 2021-02-20 ENCOUNTER — Encounter (HOSPITAL_COMMUNITY): Payer: Self-pay

## 2021-02-20 ENCOUNTER — Ambulatory Visit (HOSPITAL_COMMUNITY): Payer: No Typology Code available for payment source

## 2021-02-20 DIAGNOSIS — R262 Difficulty in walking, not elsewhere classified: Secondary | ICD-10-CM

## 2021-02-20 DIAGNOSIS — M6281 Muscle weakness (generalized): Secondary | ICD-10-CM

## 2021-02-20 NOTE — Therapy (Signed)
[facility] [address] [place], [place], [address] Phone: [phone]   Fax:  [phone]  Physical Therapy Treatment  Patient Details  Name: Lance Petty MRN: [id number] Date of Birth: February 22, 1956 Referring Provider (PT): Ty Hilts, MD   Encounter Date: 02/20/2021   PT End of Session - 02/20/21 1148    Visit Number 10    Number of Visits 15    Date for PT Re-Evaluation 03/06/21    Authorization Type [facility], 15 approved visits    Authorization - Visit Number 10    Authorization - Number of Visits 15    Progress Note Due on Visit 19    PT Start Time 1138    PT Stop Time 1216    PT Time Calculation (min) 38 min    Activity Tolerance Patient tolerated treatment well    Behavior During Therapy [facility] for tasks assessed/performed           Past Medical History:  Diagnosis Date  . Arthritis   . Diabetes mellitus without complication ([facility])   . Hypercholesteremia   . Hypertension   . Hypertension     Past Surgical History:  Procedure Laterality Date  . KNEE SURGERY    . TONSILLECTOMY    . TOTAL KNEE ARTHROPLASTY Right 12/25/2020   Procedure: RIGHT TOTAL KNEE ARTHROPLASTY;  Surgeon: Vickki Hearing, MD;  Location: [facility];  Service: Orthopedics;  Laterality: Right;    There were no vitals filed for this visit.   Subjective Assessment - 02/20/21 1147    Subjective Pt stated he is feeling pretty good today, pain scale 1/10 soreness.    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 Yes    Pain Score 1     Pain Location Knee    Pain Orientation Right    Pain Descriptors / Indicators Sore    Pain Type Surgical pain    Pain Onset 1 to 4 weeks ago    Pain Frequency Intermittent    Aggravating Factors  activity    Pain Relieving Factors rest, ice, CPM    Effect of Pain on Daily Activities limits                             [facility] Adult  PT Treatment/Exercise - 02/20/21 0001      Knee/Hip Exercises: Stretches   Active Hamstring Stretch Right;30 seconds;3 reps (P)     Active Hamstring Stretch Limitations supine with rope (P)     Knee: Self-Stretch to increase Flexion 10 seconds    Knee: Self-Stretch Limitations knee drive on 2nd step (14" height) 10x 10"      Knee/Hip Exercises: Machines for Strengthening   Other Machine Retro gait (toe to heel) for TKE 2Plates      Knee/Hip Exercises: Standing   Functional Squat 2 sets;5 reps    Functional Squat Limitations chair behind, mirror and verbal cueing for mechanics    SLS Lt 19" Rt 16" max    SLS with Vectors 1x5" BLE 1 UE support      Knee/Hip Exercises: Supine   Short Arc Quad Sets 2 sets;10 reps    Heel Slides AROM;5 sets (P)     Knee Extension AROM;Right    Knee Extension Limitations 10 (P)     Knee Flexion AROM;Right    Knee Flexion Limitations 113 (P)  Manual Therapy   Manual Therapy Soft tissue mobilization;Joint mobilization (P)     Joint Mobilization grade III R AP/PA femur/tib mobilizations tolerated well (P)     Soft tissue mobilization STM to right hamstrings and quads - tolerated well (P)                     PT Short Term Goals - 02/14/21 1136      PT SHORT TERM GOAL #1   Title Patient will be independent with HEP in order to improve functional outcomes.    Baseline 3/3  Reports compliance wiht HEP    Status On-going      PT SHORT TERM GOAL #2   Title Patient will demo right knee extension to lacking 3 degrees and right knee flexion to 110 degrees to improve transfer and gait kinematics    Baseline 01/30/21: 11-112 degrees; was 12-100 degrees initial eval    Status On-going      PT SHORT TERM GOAL #3   Title Patient will be able to ambulate at least 300 feet in in order to demonstrate improved gait speed for community ambulation.    Baseline 284    Status On-going             PT Long Term Goals - 02/14/21 1140      PT  LONG TERM GOAL #1   Title Patient will improve on FOTO score to meet predicted outcomes to improve functional independence    Baseline 57%    Time 7    Period Weeks    Status On-going      PT LONG TERM GOAL #2   Title Patient will be able to ascend/descend 4 stairs with HR and reciprocal pattern    Baseline step to step    Time 7    Period Weeks    Status On-going                 Plan - 02/20/21 1201    Clinical Impression Statement Session focus on knee mobility primarly knee extension and functional strengthening.  Added retrogait with bodycraft to improve TKE during gait.  Noted some instability while attemping SLS to put foot in strap with new exercise.  Added balance training this session with SLS and vector stance, required UE support for stability with new exercise.  AROM 10-113 degrees at EOS.    Personal Factors and Comorbidities Time since onset of injury/illness/exacerbation    Examination-Activity Limitations Carry;Lift;Stand;Stairs;Squat;Sit;Locomotion Level;Transfers    Examination-Participation Restrictions Cleaning;Community Activity;Laundry;Yard Work;Occupation    Stability/Clinical Decision Making Stable/Uncomplicated    Clinical Decision Making Low    Rehab Potential Excellent    PT Frequency 2x / week    PT Duration --   7 weeks   PT Treatment/Interventions ADLs/Self Care Home Management;Aquatic Therapy;Cryotherapy;Electrical Stimulation;DME Instruction;Gait training;Stair training;Functional mobility training;Therapeutic activities;Therapeutic exercise;Balance training;Patient/family education;Neuromuscular re-education;Manual techniques;Passive range of motion;Taping;Energy conservation;Dry needling;Spinal Manipulations;Joint Manipulations    PT Next Visit Plan Continue with LE ROM/PRE exercises, manual therapy for right knee extension. Squats with mirror for symmetry    PT Home Exercise Plan QS, knee ext mob, heel slides, side stepping, prone knee hang; 2/11  standing TKE; 3/2: SAQ and hamstring stretch.           Patient will benefit from skilled therapeutic intervention in order to improve the following deficits and impairments:  Abnormal gait,Decreased activity tolerance,Decreased mobility,Decreased knowledge of use of DME,Decreased endurance,Decreased range of motion,Decreased strength,Difficulty walking,Improper body mechanics,Pain  Visit  Diagnosis: Muscle weakness (generalized)  Difficulty in walking, not elsewhere classified     Problem List Patient Active Problem List   Diagnosis Date Noted  . 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 Primary osteoarthritis of right knee    Becky Sax, LPTA/CLT; CBIS [phone]  Juel Burrow 02/20/2021, 1:42 PM  [facility] [address] [place], [place], [address] Phone: [phone]   Fax:  [phone]  Name: Claudell Wohler MRN: [id number] Date of Birth: 11/21/1956

## 2021-02-22 ENCOUNTER — Ambulatory Visit (HOSPITAL_COMMUNITY): Payer: No Typology Code available for payment source | Admitting: Physical Therapy

## 2021-02-22 ENCOUNTER — Other Ambulatory Visit: Payer: Self-pay

## 2021-02-22 ENCOUNTER — Encounter (HOSPITAL_COMMUNITY): Payer: Self-pay | Admitting: Physical Therapy

## 2021-02-22 DIAGNOSIS — M6281 Muscle weakness (generalized): Secondary | ICD-10-CM

## 2021-02-22 DIAGNOSIS — R262 Difficulty in walking, not elsewhere classified: Secondary | ICD-10-CM

## 2021-02-22 NOTE — Therapy (Signed)
[facility] [address] [place], [place], [address] Phone: [phone]   Fax:  [phone]  Physical Therapy Treatment  Patient Details  Name: Lance Petty MRN: [id number] Date of Birth: Mar 25, 1956 Referring Provider (PT): Ty Hilts, MD   Encounter Date: 02/22/2021   PT End of Session - 02/22/21 1120    Visit Number 11    Number of Visits 15    Date for PT Re-Evaluation 03/06/21    Authorization Type [facility], 15 approved visits    Authorization - Visit Number 11    Authorization - Number of Visits 15    Progress Note Due on Visit 19    PT Start Time 1125    PT Stop Time 1205    PT Time Calculation (min) 40 min    Activity Tolerance Patient tolerated treatment well    Behavior During Therapy [facility] for tasks assessed/performed           Past Medical History:  Diagnosis Date  . Arthritis   . Diabetes mellitus without complication ([facility])   . Hypercholesteremia   . Hypertension   . Hypertension     Past Surgical History:  Procedure Laterality Date  . KNEE SURGERY    . TONSILLECTOMY    . TOTAL KNEE ARTHROPLASTY Right 12/25/2020   Procedure: RIGHT TOTAL KNEE ARTHROPLASTY;  Surgeon: Vickki Hearing, MD;  Location: [facility];  Service: Orthopedics;  Laterality: Right;    There were no vitals filed for this visit.   Subjective Assessment - 02/22/21 1134    Subjective States he is doing alright with a little soreness. 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 No/denies    Pain Onset 1 to 4 weeks ago              [facility] PT Assessment - 02/22/21 0001      Assessment   Medical Diagnosis R TKR    Referring Provider (PT) Ty Hilts, MD    Onset Date/Surgical Date 12/25/20    Next MD Visit 03/06/21    Prior Therapy Home Health therapy                         [facility] Adult PT Treatment/Exercise - 02/22/21 0001       Knee/Hip Exercises: Stretches   Active Hamstring Stretch Right;3 reps;30 seconds   on step   Gastroc Stretch Both;3 reps;30 seconds   on incline     Knee/Hip Exercises: Standing   Heel Raises Both;3 sets;5 reps;5 seconds    Other Standing Knee Exercises hamstring curls 3x10 R      Knee/Hip Exercises: Supine   Bridges --   legs extended on box - too challenging stopped after second attempt   Knee Extension AROM;Right    Knee Extension Limitations 7   lacking   Knee Flexion AROM;Right    Knee Flexion Limitations 119      Manual Therapy   Manual Therapy Soft tissue mobilization;Joint mobilization    Manual therapy comments Manual complete separate than rest of tx    Joint Mobilization grade III R AP/PA femur/tib mobilizations tolerated well    Soft tissue mobilization STM to right hamstrings and quads - tolerated well                    PT Short Term Goals - 02/14/21  1136      PT SHORT TERM GOAL #1   Title Patient will be independent with HEP in order to improve functional outcomes.    Baseline 3/3  Reports compliance wiht HEP    Status On-going      PT SHORT TERM GOAL #2   Title Patient will demo right knee extension to lacking 3 degrees and right knee flexion to 110 degrees to improve transfer and gait kinematics    Baseline 01/30/21: 11-112 degrees; was 12-100 degrees initial eval    Status On-going      PT SHORT TERM GOAL #3   Title Patient will be able to ambulate at least 300 feet in in order to demonstrate improved gait speed for community ambulation.    Baseline 284    Status On-going             PT Long Term Goals - 02/14/21 1140      PT LONG TERM GOAL #1   Title Patient will improve on FOTO score to meet predicted outcomes to improve functional independence    Baseline 57%    Time 7    Period Weeks    Status On-going      PT LONG TERM GOAL #2   Title Patient will be able to ascend/descend 4 stairs with HR and reciprocal pattern    Baseline  step to step    Time 7    Period Weeks    Status On-going                 Plan - 02/22/21 1121    Clinical Impression Statement Continued focus on terminal knee extension which improved with manual work. Will continue with current plan on knee extension focus as toelrated. Patient reported fatigue end of sesison but no increase in pain or soreness. Added hamstring curls and held heel rasises to HEP.    Personal Factors and Comorbidities Time since onset of injury/illness/exacerbation    Examination-Activity Limitations Carry;Lift;Stand;Stairs;Squat;Sit;Locomotion Level;Transfers    Examination-Participation Restrictions Cleaning;Community Activity;Laundry;Yard Work;Occupation    Stability/Clinical Decision Making Stable/Uncomplicated    Rehab Potential Excellent    PT Frequency 2x / week    PT Duration --   7 weeks   PT Treatment/Interventions ADLs/Self Care Home Management;Aquatic Therapy;Cryotherapy;Electrical Stimulation;DME Instruction;Gait training;Stair training;Functional mobility training;Therapeutic activities;Therapeutic exercise;Balance training;Patient/family education;Neuromuscular re-education;Manual techniques;Passive range of motion;Taping;Energy conservation;Dry needling;Spinal Manipulations;Joint Manipulations    PT Next Visit Plan Continue with LE ROM/PRE exercises, manual therapy for right knee extension. Squats with mirror for symmetry    PT Home Exercise Plan QS, knee ext mob, heel slides, side stepping, prone knee hang; 2/11 standing TKE; 3/2: SAQ and hamstring stretch.; 3/11 hamstring curl, heel raise holds           Patient will benefit from skilled therapeutic intervention in order to improve the following deficits and impairments:  Abnormal gait,Decreased activity tolerance,Decreased mobility,Decreased knowledge of use of DME,Decreased endurance,Decreased range of motion,Decreased strength,Difficulty walking,Improper body mechanics,Pain  Visit  Diagnosis: Muscle weakness (generalized)  Difficulty in walking, not elsewhere classified     Problem List Patient Active Problem List   Diagnosis Date Noted  . 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 Primary osteoarthritis of right knee     12:20 PM, 02/22/21 Tereasa Coop, DPT Physical Therapy with [facility]  [phone] office  [facility] [address] [place], [place], [address] Phone: [phone]   Fax:  [phone]  Name:  Lance Petty MRN: [id number] Date of Birth: [birth year]

## 2021-02-25 ENCOUNTER — Encounter (HOSPITAL_COMMUNITY): Payer: No Typology Code available for payment source | Admitting: Physical Therapy

## 2021-02-27 ENCOUNTER — Ambulatory Visit (HOSPITAL_COMMUNITY): Payer: No Typology Code available for payment source | Admitting: Physical Therapy

## 2021-02-27 ENCOUNTER — Other Ambulatory Visit: Payer: Self-pay

## 2021-02-27 ENCOUNTER — Encounter (HOSPITAL_COMMUNITY): Payer: Self-pay | Admitting: Physical Therapy

## 2021-02-27 DIAGNOSIS — R262 Difficulty in walking, not elsewhere classified: Secondary | ICD-10-CM

## 2021-02-27 DIAGNOSIS — M6281 Muscle weakness (generalized): Secondary | ICD-10-CM

## 2021-02-27 NOTE — Therapy (Signed)
[facility] [address] [place], [place], [address] Phone: [phone]   Fax:  [phone]  Physical Therapy Treatment  Patient Details  Name: Lance Petty MRN: [id number] Date of Birth: 05-May-1956 Referring Provider (PT): Ty Hilts, MD   Encounter Date: 02/27/2021   PT End of Session - 02/27/21 1039    Visit Number 12    Number of Visits 15    Date for PT Re-Evaluation 03/06/21    Authorization Type [facility], 15 approved visits    Authorization - Visit Number 12    Authorization - Number of Visits 15    Progress Note Due on Visit 19    PT Start Time 1040    PT Stop Time 1120    PT Time Calculation (min) 40 min    Activity Tolerance Patient tolerated treatment well    Behavior During Therapy [facility] for tasks assessed/performed           Past Medical History:  Diagnosis Date  . Arthritis   . Diabetes mellitus without complication (HCC)   . Hypercholesteremia   . Hypertension   . Hypertension     Past Surgical History:  Procedure Laterality Date  . KNEE SURGERY    . TONSILLECTOMY    . TOTAL KNEE ARTHROPLASTY Right 12/25/2020   Procedure: RIGHT TOTAL KNEE ARTHROPLASTY;  Surgeon: Vickki Hearing, MD;  Location: [facility];  Service: Orthopedics;  Laterality: Right;    There were no vitals filed for this visit.   Subjective Assessment - 02/27/21 1043    Subjective States that he doesn't have much pain just some tightness along the lateral side of the right knee. States he was a little sore after last session.    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 Yes    Pain Score 1     Pain Location Knee    Pain Orientation Right;Lateral    Pain Descriptors / Indicators Tightness    Pain Onset 1 to 4 weeks ago              [facility] PT Assessment - 02/27/21 0001      Assessment   Medical Diagnosis R TKR    Referring Provider (PT) Ty Hilts, MD     Onset Date/Surgical Date 12/25/20    Next MD Visit 03/06/21    Prior Therapy Home Health therapy                         [facility] Adult PT Treatment/Exercise - 02/27/21 0001      Ambulation/Gait   Stair Management Technique One rail Left;Alternating pattern    Number of Stairs 4    Height of Stairs 7    Gait Comments 2x5 - cues for form      Knee/Hip Exercises: Stretches   Other Knee/Hip Stretches knee flexion and extension stretch on steps x5 20" holds in each position Right      Knee/Hip Exercises: Standing   Gait Training --    Other Standing Knee Exercises staggered stance on step (right foot on floor) 4x 30" holds right side only    Other Standing Knee Exercises marching x10 R for knee flexion; pulley TKE right, 2 plates, 4T65, 5" holds - UE assist on chair, retro TKE right 1 plate 4Y50 with 5" holds pulling into extension      Knee/Hip Exercises: Supine  Knee Extension AROM;Right    Knee Extension Limitations 8   lacking   Knee Flexion AROM;Right    Knee Flexion Limitations 118      Manual Therapy   Manual Therapy Soft tissue mobilization    Manual therapy comments Manual complete separate than rest of tx    Soft tissue mobilization STM to right hamstrings, calf and quads - tolerated well                    PT Short Term Goals - 02/14/21 1136      PT SHORT TERM GOAL #1   Title Patient will be independent with HEP in order to improve functional outcomes.    Baseline 3/3  Reports compliance wiht HEP    Status On-going      PT SHORT TERM GOAL #2   Title Patient will demo right knee extension to lacking 3 degrees and right knee flexion to 110 degrees to improve transfer and gait kinematics    Baseline 01/30/21: 11-112 degrees; was 12-100 degrees initial eval    Status On-going      PT SHORT TERM GOAL #3   Title Patient will be able to ambulate at least 300 feet in in order to demonstrate improved gait speed for community ambulation.     Baseline 284    Status On-going             PT Long Term Goals - 02/14/21 1140      PT LONG TERM GOAL #1   Title Patient will improve on FOTO score to meet predicted outcomes to improve functional independence    Baseline 57%    Time 7    Period Weeks    Status On-going      PT LONG TERM GOAL #2   Title Patient will be able to ascend/descend 4 stairs with HR and reciprocal pattern    Baseline step to step    Time 7    Period Weeks    Status On-going                 Plan - 02/27/21 1039    Clinical Impression Statement Added pulley TKEs today which was tolerated moderately well. Verbal cues for form throughout session. Continued limitations noted in terminal knee extension so interventions focused on this. With descent of stairs patient almost had increased difficulties noted on left, cued for proper mechanics and patient able to improve form with focus. Continued with STM to posterior right knee which was tolerated well and encouraged patient to continue to perform at home.    Personal Factors and Comorbidities Time since onset of injury/illness/exacerbation    Examination-Activity Limitations Carry;Lift;Stand;Stairs;Squat;Sit;Locomotion Level;Transfers    Examination-Participation Restrictions Cleaning;Community Activity;Laundry;Yard Work;Occupation    Stability/Clinical Decision Making Stable/Uncomplicated    Rehab Potential Excellent    PT Frequency 2x / week    PT Duration --   7 weeks   PT Treatment/Interventions ADLs/Self Care Home Management;Aquatic Therapy;Cryotherapy;Electrical Stimulation;DME Instruction;Gait training;Stair training;Functional mobility training;Therapeutic activities;Therapeutic exercise;Balance training;Patient/family education;Neuromuscular re-education;Manual techniques;Passive range of motion;Taping;Energy conservation;Dry needling;Spinal Manipulations;Joint Manipulations    PT Next Visit Plan Continue with LE ROM/PRE exercises, manual therapy  for right knee extension. Squats with mirror for symmetry    PT Home Exercise Plan QS, knee ext mob, heel slides, side stepping, prone knee hang; 2/11 standing TKE; 3/2: SAQ and hamstring stretch.; 3/11 hamstring curl, heel raise holds           Patient will benefit from  skilled therapeutic intervention in order to improve the following deficits and impairments:  Abnormal gait,Decreased activity tolerance,Decreased mobility,Decreased knowledge of use of DME,Decreased endurance,Decreased range of motion,Decreased strength,Difficulty walking,Improper body mechanics,Pain  Visit Diagnosis: Muscle weakness (generalized)  Difficulty in walking, not elsewhere classified     Problem List Patient Active Problem List   Diagnosis Date Noted  . 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 Primary osteoarthritis of right knee     11:25 AM, 02/27/21 Tereasa Coop, DPT Physical Therapy with [facility]  [phone] office  [facility] [address] [place], [place], [address] Phone: [phone]   Fax:  [phone]  Name: Audrey Eller MRN: [id number] Date of Birth: 1956-01-06

## 2021-03-01 ENCOUNTER — Encounter (HOSPITAL_COMMUNITY): Payer: No Typology Code available for payment source

## 2021-03-01 ENCOUNTER — Ambulatory Visit (HOSPITAL_COMMUNITY): Payer: No Typology Code available for payment source

## 2021-03-04 ENCOUNTER — Encounter (HOSPITAL_COMMUNITY): Payer: No Typology Code available for payment source

## 2021-03-05 ENCOUNTER — Ambulatory Visit (HOSPITAL_COMMUNITY): Payer: No Typology Code available for payment source | Admitting: Physical Therapy

## 2021-03-05 ENCOUNTER — Other Ambulatory Visit: Payer: Self-pay

## 2021-03-05 DIAGNOSIS — R262 Difficulty in walking, not elsewhere classified: Secondary | ICD-10-CM

## 2021-03-05 DIAGNOSIS — M6281 Muscle weakness (generalized): Secondary | ICD-10-CM

## 2021-03-05 NOTE — Therapy (Signed)
[facility] [address] [place], [place], [address] Phone: [phone]   Fax:  [phone]  Physical Therapy Treatment  Patient Details  Name: Lance Petty MRN: [id number] Date of Birth: 14-Jun-1956 Referring Provider (PT): Adonis Huguenin, MD PHYSICAL THERAPY DISCHARGE SUMMARY  Visits from Start of Care: 13  Current functional level related to goals / functional outcomes: All goals met :  ROM 3-118: ambulate without AD 339' in 2 minutes    Remaining deficits: Swelling    Education / Equipment: HEP Plan: Patient agrees to discharge.  Patient goals were met. Patient is being discharged due to meeting the stated rehab goals.  ?????       Encounter Date: 03/05/2021   PT End of Session - 03/05/21 0921    Visit Number 13    Number of Visits 15    Date for PT Re-Evaluation 03/06/21    Authorization Type [place], 15 approved visits    Authorization - Visit Number 13    Authorization - Number of Visits 15         TIME in 9:20 Out      10:00  Past Medical History:  Diagnosis Date  . Arthritis   . Diabetes mellitus without complication ([place])   . Hypercholesteremia   . Hypertension   . Hypertension     Past Surgical History:  Procedure Laterality Date  . KNEE SURGERY    . TONSILLECTOMY    . TOTAL KNEE ARTHROPLASTY Right 12/25/2020   Procedure: RIGHT TOTAL KNEE ARTHROPLASTY;  Surgeon: Carole Civil, MD;  Location: [facility];  Service: Orthopedics;  Laterality: Right;    There were no vitals filed for this visit.   Subjective Assessment - 03/05/21 0922    Subjective PT states that at times he gets up and walks without his cane.  He has no pain.    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 No/denies              [facility] PT Assessment - 03/05/21 0001      Assessment   Medical Diagnosis R TKR    Referring Provider (PT) Adonis Huguenin, MD     Onset Date/Surgical Date 12/25/20    Next MD Visit 03/06/21    Prior [place] therapy      Observation/Other Assessments   Focus on Therapeutic Outcomes (FOTO)  69% function was 57      Functional Tests   Functional tests Single leg stance;Sit to Stand      Single Leg Stance   Comments Lt: 51";  RT" 35"      Sit to Stand   Comments 12 in 30 seconeds      AROM   Right Knee Extension 3    Right Knee Flexion 118      Strength   Strength Assessment Site Hip    Right/Left Hip Right    Right Hip Flexion 5/5    Right Hip Extension 4-/5    Right Hip ABduction 5/5    Right/Left Knee Right    Right Knee Flexion 4-/5    Right Knee Extension 5/5      Ambulation/Gait   Ambulation Distance (Feet) 339 Feet    Assistive device None    Gait Comments 2 minute test                PT Short Term Goals -  03/05/21 0956      PT SHORT TERM GOAL #1   Title Patient will be independent with HEP in order to improve functional outcomes.    Baseline 3/3  Reports compliance wiht HEP    Status Achieved      PT SHORT TERM GOAL #2   Title Patient will demo right knee extension to lacking 3 degrees and right knee flexion to 110 degrees to improve transfer and gait kinematics    Baseline 01/30/21: 11-112 degrees; was 12-100 degrees initial eval    Status Achieved      PT SHORT TERM GOAL #3   Title Patient will be able to ambulate at least 300 feet in 2MWT in order to demonstrate improved gait speed for community ambulation.    Baseline 284    Status Achieved             PT Long Term Goals - 03/05/21 0957      PT LONG TERM GOAL #1   Title Patient will improve on FOTO score to meet predicted outcomes to improve functional independence    Status Achieved      PT LONG TERM GOAL #2   Title Patient will be able to ascend/descend 4 stairs with HR and reciprocal pattern    Status Achieved                 Plan - 03/05/21 1003    Clinical Impression Statement Pt  reassessed.  Improved gt when not using his cane.  All goals have been met pt is ready for discharge.    Personal Factors and Comorbidities Time since onset of injury/illness/exacerbation    Examination-Activity Limitations Carry;Lift;Stand;Stairs;Squat;Sit;Locomotion Level;Transfers    Examination-Participation Restrictions Cleaning;Community Activity;Laundry;Yard Work;Occupation    Stability/Clinical Decision Making Stable/Uncomplicated    Clinical Decision Making Low    Rehab Potential Excellent    PT Frequency 2x / week    PT Treatment/Interventions ADLs/Self Care Home Management;Aquatic Therapy;Cryotherapy;Electrical Stimulation;DME Instruction;Gait training;Stair training;Functional mobility training;Therapeutic activities;Therapeutic exercise;Balance training;Patient/family education;Neuromuscular re-education;Manual techniques;Passive range of motion;Taping;Energy conservation;Dry needling;Spinal Manipulations;Joint Manipulations    PT Next Visit Plan Discharge    PT Home Exercise Plan QS, knee ext mob, heel slides, side stepping, prone knee hang; 2/11 standing TKE; 3/2: SAQ and hamstring stretch.; 3/11 hamstring curl, heel raise holds; 3/22: prone hip extension, add wt to ham curls.           Patient will benefit from skilled therapeutic intervention in order to improve the following deficits and impairments:  Abnormal gait,Decreased activity tolerance,Decreased mobility,Decreased knowledge of use of DME,Decreased endurance,Decreased range of motion,Decreased strength,Difficulty walking,Improper body mechanics,Pain  Visit Diagnosis: Difficulty in walking, not elsewhere classified  Muscle weakness (generalized)     Problem List Patient Active Problem List   Diagnosis Date Noted  . 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 Primary osteoarthritis of right knee     Rayetta Humphrey, PT CLT [phone] 03/05/2021, 10:05 AM  [facility] [address] [place], [place], [address] Phone: [phone]   Fax:  [phone]  Name: Jeury Mcnab MRN: [id number] Date of Birth: 1956/02/03

## 2021-03-06 ENCOUNTER — Ambulatory Visit (INDEPENDENT_AMBULATORY_CARE_PROVIDER_SITE_OTHER): Payer: No Typology Code available for payment source | Admitting: Orthopedic Surgery

## 2021-03-06 ENCOUNTER — Ambulatory Visit (HOSPITAL_COMMUNITY): Payer: No Typology Code available for payment source | Admitting: Physical Therapy

## 2021-03-06 ENCOUNTER — Encounter: Payer: Self-pay | Admitting: Orthopedic Surgery

## 2021-03-06 ENCOUNTER — Encounter (HOSPITAL_COMMUNITY): Payer: No Typology Code available for payment source

## 2021-03-06 DIAGNOSIS — I10 Essential (primary) hypertension: Secondary | ICD-10-CM | POA: Insufficient documentation

## 2021-03-06 DIAGNOSIS — Z96651 Presence of right artificial knee joint: Secondary | ICD-10-CM

## 2021-03-06 DIAGNOSIS — E119 Type 2 diabetes mellitus without complications: Secondary | ICD-10-CM | POA: Insufficient documentation

## 2021-03-06 DIAGNOSIS — E785 Hyperlipidemia, unspecified: Secondary | ICD-10-CM | POA: Insufficient documentation

## 2021-03-06 NOTE — Progress Notes (Signed)
Chief Complaint  Patient presents with  . Post-op Follow-up    Total knee replacement right 12/25/20    Lance Petty is 2 months out from his right total knee he did well with physical therapy is walking without his cane his range of motion is approximately 3-1 25  He is stable in extension and flexion  Recommend gradual return to normal activity follow-up in 4 months for 76-month follow-up

## 2021-03-08 ENCOUNTER — Encounter (HOSPITAL_COMMUNITY): Payer: No Typology Code available for payment source | Admitting: Physical Therapy

## 2021-03-11 ENCOUNTER — Encounter (HOSPITAL_COMMUNITY): Payer: No Typology Code available for payment source | Admitting: Physical Therapy

## 2021-03-13 ENCOUNTER — Encounter (HOSPITAL_COMMUNITY): Payer: No Typology Code available for payment source | Admitting: Physical Therapy

## 2021-03-15 ENCOUNTER — Encounter (HOSPITAL_COMMUNITY): Payer: No Typology Code available for payment source

## 2021-04-04 ENCOUNTER — Telehealth: Payer: Self-pay | Admitting: Orthopedic Surgery

## 2021-04-04 MED ORDER — CEPHALEXIN 500 MG PO CAPS: 2000.0000 mg | ORAL_CAPSULE | Freq: Once | ORAL | 2 refills | Status: AC

## 2021-04-04 NOTE — Telephone Encounter (Signed)
Sent in per protocol  

## 2021-04-04 NOTE — Telephone Encounter (Signed)
Patient called to request pre-antibiotics for dental appointment scheduled 04/22/21; states uses [facility] [place]; said no allergies to medications. Best phone# [phone]

## 2021-07-08 ENCOUNTER — Encounter: Payer: Self-pay | Admitting: Orthopedic Surgery

## 2021-07-08 ENCOUNTER — Other Ambulatory Visit: Payer: Self-pay

## 2021-07-08 ENCOUNTER — Ambulatory Visit (INDEPENDENT_AMBULATORY_CARE_PROVIDER_SITE_OTHER): Payer: No Typology Code available for payment source | Admitting: Orthopedic Surgery

## 2021-07-08 VITALS — BP 142/79 | HR 73 | Ht 70.0 in | Wt 230.0 lb

## 2021-07-08 DIAGNOSIS — Z96651 Presence of right artificial knee joint: Secondary | ICD-10-CM

## 2021-07-08 DIAGNOSIS — M1711 Unilateral primary osteoarthritis, right knee: Secondary | ICD-10-CM | POA: Diagnosis not present

## 2021-07-08 NOTE — Progress Notes (Signed)
FOLLOW UP   Encounter Diagnosis  Name Primary?   Status post total right knee replacement 12/25/20 Yes     Chief Complaint  Patient presents with   Post-op Follow-up    12/25/20 right knee has soreness stiffness if sitting too long     Lance Petty is on oxycodone as he was prior to surgery and he has a prescription from Dr. Janna Arch who is retiring  His next refill is due August 6  He complains giving when walking on unlevel ground and stiffness after sitting for long periods of time  He has mild quadriceps weakness but no extensor lag he has full extension and 115 degrees of knee flexion with small effusion without signs of any infection  Recommend he continue quadricep strengthening exercises and see [place] in 3 months

## 2021-08-16 ENCOUNTER — Other Ambulatory Visit: Payer: Self-pay | Admitting: Orthopedic Surgery

## 2021-08-16 NOTE — Telephone Encounter (Signed)
Patient called requesting refill for his pain medicine.     oxycodone (OXY-IR) 5 MG capsule  Pharmacy:  [facility] in [place]  

## 2021-08-21 MED ORDER — OXYCODONE HCL 5 MG PO CAPS: 5.0000 mg | ORAL_CAPSULE | ORAL | 0 refills | Status: DC | PRN

## 2021-09-18 ENCOUNTER — Other Ambulatory Visit: Payer: Self-pay | Admitting: Radiology

## 2021-09-18 MED ORDER — OXYCODONE HCL 5 MG PO CAPS: 5.0000 mg | ORAL_CAPSULE | ORAL | 0 refills | Status: DC | PRN

## 2021-10-07 ENCOUNTER — Ambulatory Visit (INDEPENDENT_AMBULATORY_CARE_PROVIDER_SITE_OTHER): Payer: No Typology Code available for payment source | Admitting: Orthopedic Surgery

## 2021-10-07 ENCOUNTER — Other Ambulatory Visit: Payer: Self-pay

## 2021-10-07 ENCOUNTER — Encounter: Payer: Self-pay | Admitting: Orthopedic Surgery

## 2021-10-07 VITALS — BP 153/80 | HR 73 | Ht 71.0 in | Wt 235.8 lb

## 2021-10-07 DIAGNOSIS — G8929 Other chronic pain: Secondary | ICD-10-CM | POA: Diagnosis not present

## 2021-10-07 MED ORDER — OXYCODONE HCL 5 MG PO CAPS: 5.0000 mg | ORAL_CAPSULE | ORAL | 0 refills | Status: DC | PRN

## 2021-10-07 NOTE — Progress Notes (Signed)
Chief Complaint  Patient presents with   s/p TKR right    Follow up  DOS 12/25/20   65 year old male status post right total knee complains of pain and swelling had an x-ray at the [place] in October 2022 that x-ray report reveals normal alignment no signs of complication hardware intact  His knees show some swelling he has full extension 105 degrees of flexion pain with terminal flexion  No signs of infection  He is ambulating slight limp  Recommend he continue to ice his knee continue his exercises  He needs to find a primary care doctor as his has retired to prove prescribe his chronic oxycodone which she was taken prior to surgery  Meds ordered this encounter  Medications   oxycodone (OXY-IR) 5 MG capsule    Sig: Take 1 capsule (5 mg total) by mouth every 4 (four) hours as needed.    Dispense:  30 capsule    Refill:  0   I will see him at his 1 year follow-up he does not need an x-ray at that time

## 2021-11-13 ENCOUNTER — Other Ambulatory Visit: Payer: Self-pay | Admitting: Orthopedic Surgery

## 2021-11-13 DIAGNOSIS — G8929 Other chronic pain: Secondary | ICD-10-CM

## 2021-11-13 MED ORDER — OXYCODONE HCL 5 MG PO CAPS: 5.0000 mg | ORAL_CAPSULE | ORAL | 0 refills | Status: DC | PRN

## 2021-11-13 NOTE — Telephone Encounter (Signed)
Patient called requesting refill for his pain medicine.     oxycodone (OXY-IR) 5 MG capsule  Pharmacy:  [facility] in [place]

## 2022-01-06 ENCOUNTER — Encounter: Payer: Self-pay | Admitting: Orthopedic Surgery

## 2022-01-06 ENCOUNTER — Other Ambulatory Visit: Payer: Self-pay

## 2022-01-06 ENCOUNTER — Ambulatory Visit (INDEPENDENT_AMBULATORY_CARE_PROVIDER_SITE_OTHER): Payer: No Typology Code available for payment source | Admitting: Orthopedic Surgery

## 2022-01-06 ENCOUNTER — Telehealth: Payer: Self-pay | Admitting: Radiology

## 2022-01-06 DIAGNOSIS — G8929 Other chronic pain: Secondary | ICD-10-CM

## 2022-01-06 DIAGNOSIS — M1710 Unilateral primary osteoarthritis, unspecified knee: Secondary | ICD-10-CM

## 2022-01-06 DIAGNOSIS — Z96651 Presence of right artificial knee joint: Secondary | ICD-10-CM | POA: Diagnosis not present

## 2022-01-06 MED ORDER — OXYCODONE HCL 5 MG PO CAPS: 5.0000 mg | ORAL_CAPSULE | ORAL | 0 refills | Status: AC | PRN

## 2022-01-06 NOTE — Progress Notes (Signed)
FOLLOW UP   Encounter Diagnoses  Name Primary?   Status post total right knee replacement 12/25/20    Primary localized osteoarthritis of knee Yes   Encounter for chronic pain management      Chief Complaint  Patient presents with   Knee Problem    TKA right knee 12/25/20     Lance Petty had a knee replacement a year ago he says he has good and bad days.  He was on OxyContin IR pain prior to his knee surgery and has still required that medication even though his knee pain is better  On exam he has an extensor lag of 20 degrees but passively extends to 0 he has 110 degrees of flexion with mild laxity AP and flexion.  No swelling.  At this point I think his knee is in pretty good condition he has improved since his preop status with decreased knee pain and improved commend pain management to handle his oxycodone and follow-up in 2 years for x-ray  Meds ordered this encounter  Medications   oxycodone (OXY-IR) 5 MG capsule    Sig: Take 1 capsule (5 mg total) by mouth every 4 (four) hours as needed.    Dispense:  30 capsule    Refill:  0

## 2022-01-06 NOTE — Telephone Encounter (Signed)
Patient advised we can not refer to pain management he will have to get [facility] to refer.  He voiced understanding

## 2022-01-06 NOTE — Patient Instructions (Signed)
You will get a call from pain management to schedule appointment [facility] Pet[facility] pain management

## 2022-08-25 IMAGING — DX DG KNEE 1-2V PORT*R*
2 series · 2 of 2 positions shown · non-contrast
Comparison: November 01, 2019

CLINICAL DATA: Status post total knee replacement

EXAM:
PORTABLE RIGHT KNEE - 1-2 VIEW

[knee ap]
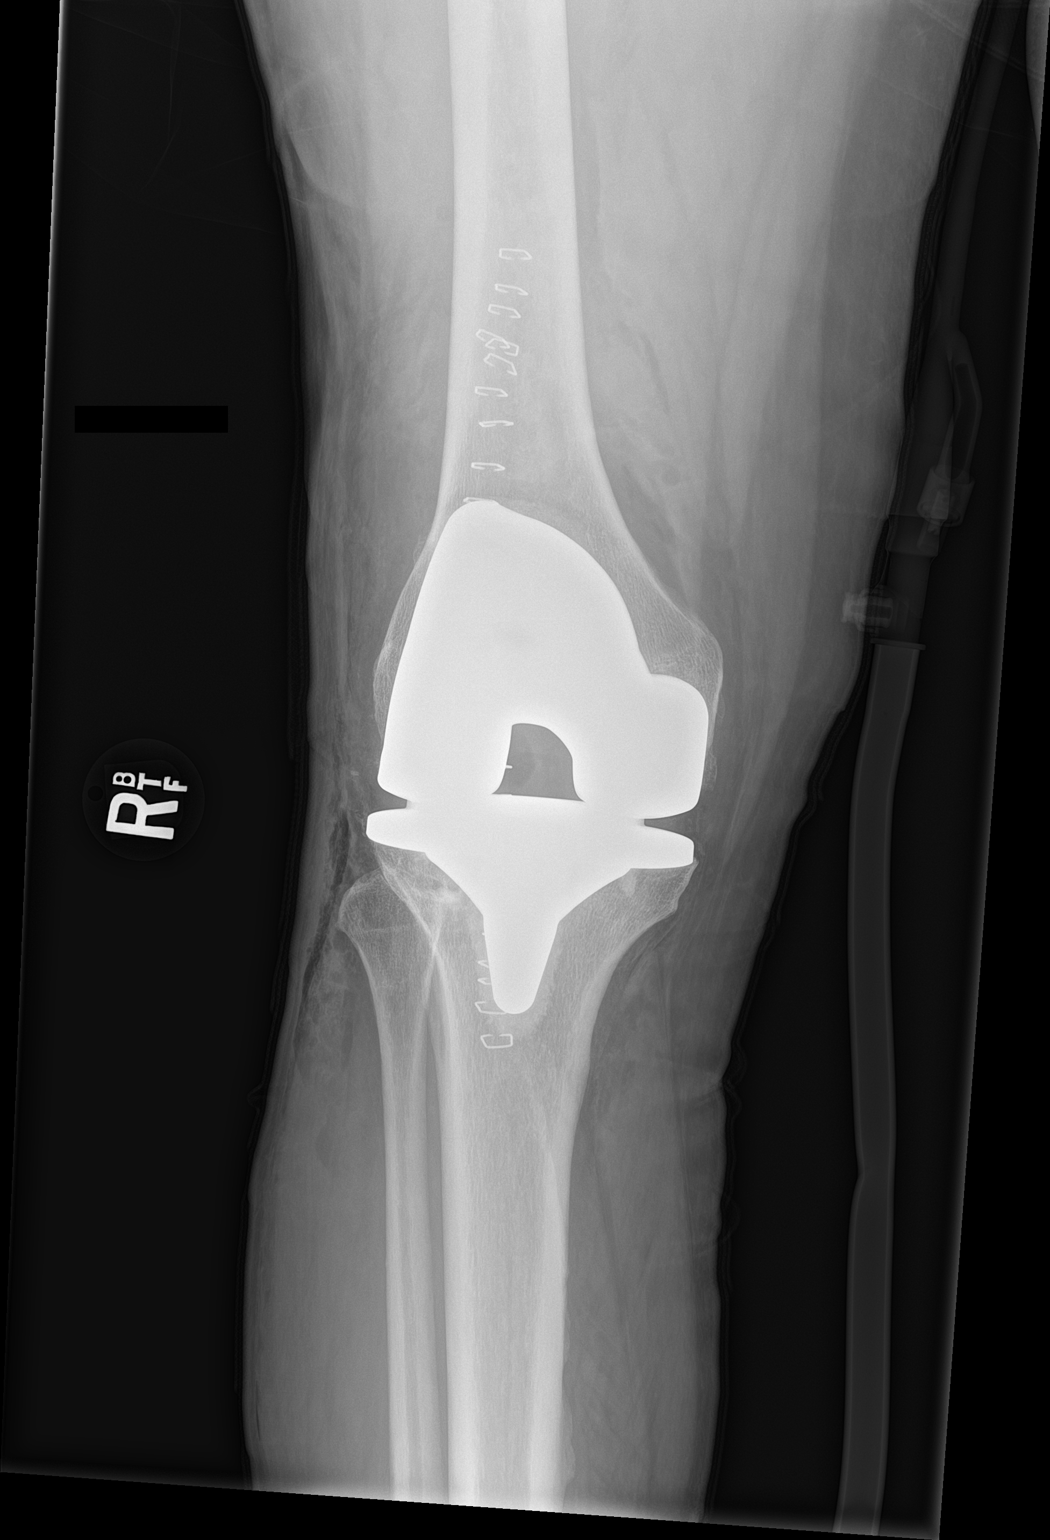

[knee lat]
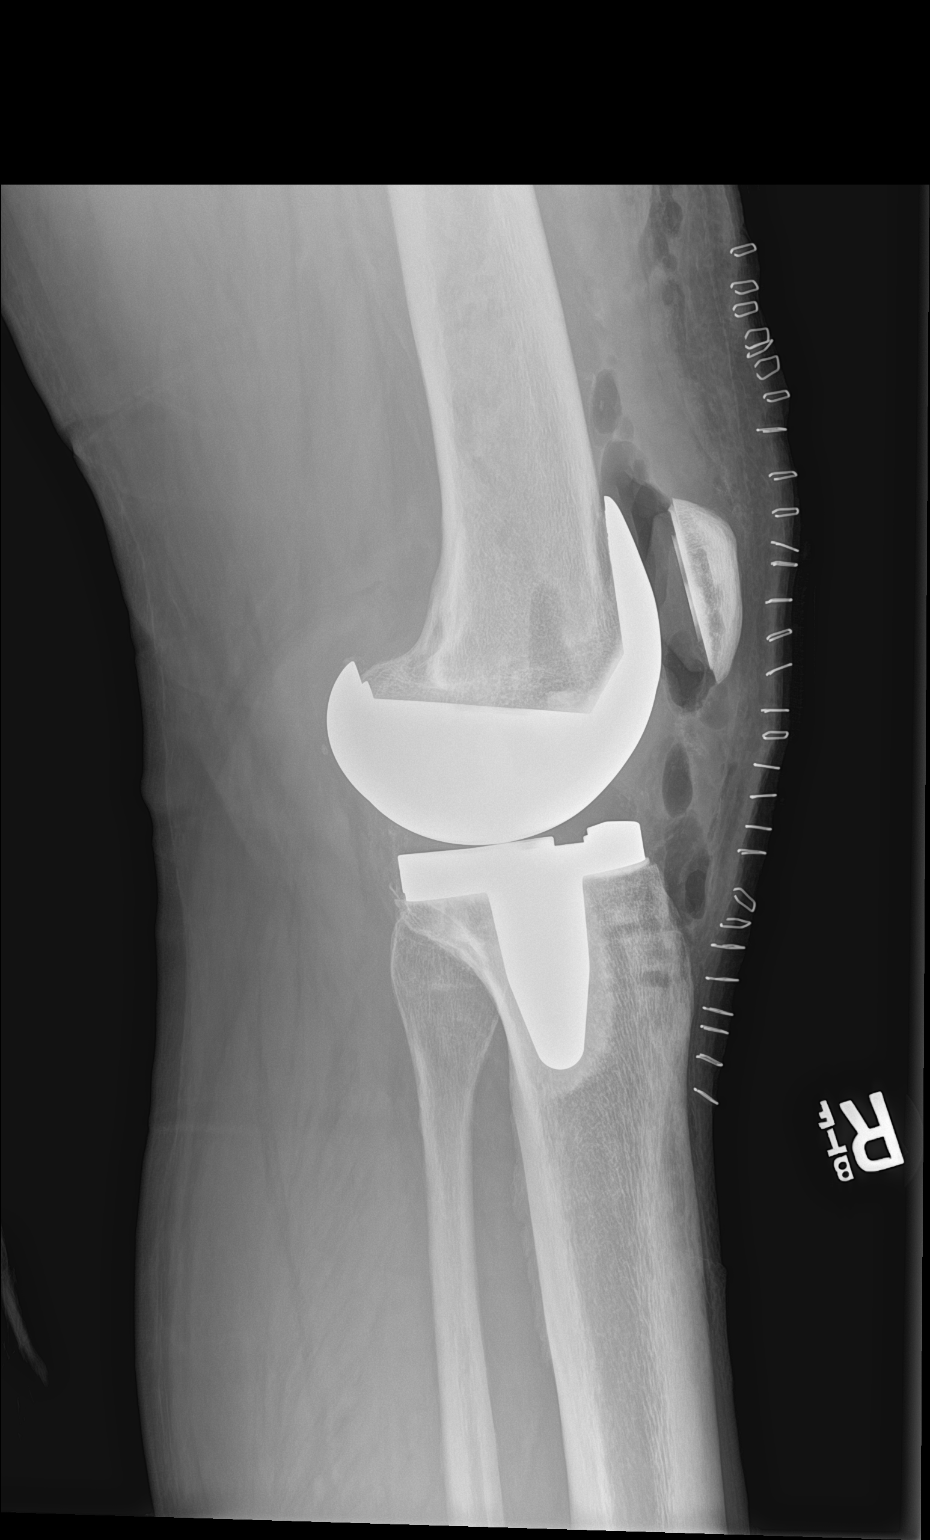

[2 of 2 positions shown; findings below may reference images not displayed]

FINDINGS: Frontal and lateral views were obtained. Patient is status post
total knee replacement with femoral, tibial, and patellar prosthetic
components well-seated. No fracture or dislocation. No erosion. Air
is present within the knee joint, an expected acute postoperative
finding.
IMPRESSION: Status post total knee replacement with prosthetic components
well-seated. No fracture or dislocation. Acute postoperative changes
noted.

## 2023-03-26 ENCOUNTER — Ambulatory Visit (INDEPENDENT_AMBULATORY_CARE_PROVIDER_SITE_OTHER): Payer: No Typology Code available for payment source | Admitting: Orthopedic Surgery

## 2023-03-26 VITALS — BP 155/75 | HR 66 | Ht 71.0 in | Wt 235.0 lb

## 2023-03-26 DIAGNOSIS — M25561 Pain in right knee: Secondary | ICD-10-CM

## 2023-03-26 DIAGNOSIS — G8929 Other chronic pain: Secondary | ICD-10-CM | POA: Diagnosis not present

## 2023-03-26 DIAGNOSIS — T84032A Mechanical loosening of internal right knee prosthetic joint, initial encounter: Secondary | ICD-10-CM

## 2023-03-26 DIAGNOSIS — Z96651 Presence of right artificial knee joint: Secondary | ICD-10-CM | POA: Diagnosis not present

## 2023-03-26 NOTE — Progress Notes (Signed)
Chief Complaint  Patient presents with   Knee Pain    R knee pain and swelling over the past 6 mos. States he's always had soreness in the knee but pain is getting bad   Patient complains of lateral leg pain as well as anterior and diffuse knee pain worse over the last 6 months with increased swelling  Patient reports severe pain when getting out of a chair after has been driving and when he gets back up  The pain related to the knee is around the knee the patella medial and lateral joint  And he has swelling  He is still using a cane to walk    Review of systems no fever no redness no chills no drainage  Right Knee Exam   Muscle Strength  The patient has normal right knee strength.  Tenderness  Right knee tenderness location: Diffuse peripatellar medial joint line and lateral joint line.  Range of Motion  Extension:  5  Flexion:  110   Tests  Drawer:  Anterior - negative    Posterior - negative  Other  Erythema: absent Scars: present Sensation: normal Pulse: present Swelling: moderate Effusion: no effusion present  Comments:  He is using a cane he seems to have a limp the knee was not warm to touch it was not really an effusion but there was diffuse swelling     Assessment and plan  Encounter Diagnoses  Name Primary?   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  He does not appear to have flexion gap instability  There is possibility of loosening versus infection  Imaging was done at the [facility] please see media images.  There is suggestion of loosening of the femoral prosthesis but this could be projectional and x-ray on a CD causing image distortion  Recommend workup for infection to include CBC sed rate C-reactive protein and triple phase bone scan  Patient is referred to pain management for  chronic pain  Also note patient had tenderness along the lateral iliotibial band up to about the greater trochanter this extended down to approximately two thirds down the tibia suggesting perhaps iliotibial tightness versus radicular symptoms  Denied back pain

## 2023-03-29 LAB — SEDIMENTATION RATE: Sed Rate: 2 mm/h (ref 0–20)

## 2023-03-29 LAB — CBC WITH DIFFERENTIAL/PLATELET
Absolute Monocytes: 787 cells/uL (ref 200–950)
Basophils Absolute: 48 cells/uL (ref 0–200)
Basophils Relative: 0.5 %
Eosinophils Absolute: 182 cells/uL (ref 15–500)
Eosinophils Relative: 1.9 %
HCT: 42.8 % (ref 38.5–50.0)
Hemoglobin: 14.3 g/dL (ref 13.2–17.1)
Lymphs Abs: 3235 cells/uL (ref 850–3900)
MCH: 29.9 pg (ref 27.0–33.0)
MCHC: 33.4 g/dL (ref 32.0–36.0)
MCV: 89.4 fL (ref 80.0–100.0)
MPV: 11.2 fL (ref 7.5–12.5)
Monocytes Relative: 8.2 %
Neutro Abs: 5347 cells/uL (ref 1500–7800)
Neutrophils Relative %: 55.7 %
Platelets: 200 10*3/uL (ref 140–400)
RBC: 4.79 10*6/uL (ref 4.20–5.80)
RDW: 13.4 % (ref 11.0–15.0)
Total Lymphocyte: 33.7 %
WBC: 9.6 10*3/uL (ref 3.8–10.8)

## 2023-03-29 LAB — C-REACTIVE PROTEIN: CRP: 4.5 mg/L (ref <8.0)

## 2023-03-30 ENCOUNTER — Telehealth: Payer: Self-pay | Admitting: Orthopedic Surgery

## 2023-03-30 NOTE — Telephone Encounter (Signed)
Spoke w[facility] today, confirmed that the patient kept his 03/26/23 appointment and faxed the office note to [phone] attn Nurse

## 2023-04-06 ENCOUNTER — Encounter (HOSPITAL_COMMUNITY)
Admission: RE | Admit: 2023-04-06 | Discharge: 2023-04-06 | Disposition: A | Discharge status: Home / Self Care | Payer: No Typology Code available for payment source | Source: Ambulatory Visit | Attending: Orthopedic Surgery | Admitting: Orthopedic Surgery

## 2023-04-06 DIAGNOSIS — M25561 Pain in right knee: Secondary | ICD-10-CM | POA: Insufficient documentation

## 2023-04-06 DIAGNOSIS — G8929 Other chronic pain: Secondary | ICD-10-CM | POA: Diagnosis present

## 2023-04-06 DIAGNOSIS — Z96651 Presence of right artificial knee joint: Secondary | ICD-10-CM | POA: Diagnosis present

## 2023-04-06 MED ORDER — TECHNETIUM TC 99M MEDRONATE IV KIT
20.0000 | PACK | Freq: Once | INTRAVENOUS | Status: AC | PRN
  Administered 2023-04-06: 19.9 via INTRAVENOUS

## 2023-04-13 ENCOUNTER — Telehealth: Payer: Self-pay | Admitting: Orthopedic Surgery

## 2023-04-13 DIAGNOSIS — Z96651 Presence of right artificial knee joint: Secondary | ICD-10-CM

## 2023-04-13 DIAGNOSIS — G8929 Other chronic pain: Secondary | ICD-10-CM

## 2023-04-13 NOTE — Telephone Encounter (Signed)
Dr. Mort Sawyers pt - spoke w/the patient, he stated Dr. Romeo Apple sent him for labs and he's checking on those results.

## 2023-04-14 ENCOUNTER — Telehealth: Payer: Self-pay | Admitting: Orthopedic Surgery

## 2023-04-14 NOTE — Telephone Encounter (Signed)
Reviewed Mr. Lance Petty's bone scan did not show signs of loosening or infection laboratory parameters sed rate C-reactive protein are normal  I told him we would get him a second opinion with Dr. Wadie Lessen group

## 2023-04-15 NOTE — Telephone Encounter (Signed)
I put in the referral, but he has to do this through the [facility] Orthopedic surgeon in [place], [place] [facility]  Address: [address], [place], [place] [address] Phone: [phone]  I called him he was driving I told him to call back I will put the information in the phone note and someone can tell him when he calls back   To you FYI this goes through the [place]

## 2023-04-22 ENCOUNTER — Telehealth: Payer: Self-pay | Admitting: Orthopedic Surgery

## 2023-04-22 NOTE — Telephone Encounter (Signed)
Dr. Mort Sawyers pt - spoke w/the patient, he stated that Dr. Romeo Apple was supposed to be sending him somewhere for a second opinion and that Dr. Romeo Apple told him that we would find out if the [facility] will cover it and he hasn't heard anything.  Please call the patient and advise.

## 2023-04-22 NOTE — Telephone Encounter (Signed)
Returned the patient's call, lvm.  He was calling regarding a referral.  LVM for the patient to call us back to see exactly what he is referring to.

## 2023-04-23 NOTE — Telephone Encounter (Signed)
No, I called patient told him [facility] has to [facility] he needs to let them know. I called him again to explain he wanted me to text to him, I advised him I can not but he can sign up for my chart, he declined asked me to print out the information. I have done this he will pick up tomorrow

## 2024-01-06 NOTE — Progress Notes (Deleted)
   There were no vitals taken for this visit.  There is no height or weight on file to calculate BMI.  No chief complaint on file.   Encounter Diagnoses  Name Primary?   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

## 2024-01-11 ENCOUNTER — Ambulatory Visit: Payer: No Typology Code available for payment source | Admitting: Orthopedic Surgery

## 2024-01-11 DIAGNOSIS — Z96651 Presence of right artificial knee joint: Secondary | ICD-10-CM

## 2024-01-11 DIAGNOSIS — T84032D Mechanical loosening of internal right knee prosthetic joint, subsequent encounter: Secondary | ICD-10-CM
